# Patient Record
Sex: Female | Born: 1990 | Race: White | Hispanic: No | Marital: Married | Smoking: Never smoker
Health system: Southern US, Community
[De-identification: ages and names within clinical notes are randomized; demographics above are authoritative.]

## PROBLEM LIST (undated history)

## (undated) DIAGNOSIS — J302 Other seasonal allergic rhinitis: Secondary | ICD-10-CM

## (undated) DIAGNOSIS — Z87440 Personal history of urinary (tract) infections: Secondary | ICD-10-CM

## (undated) DIAGNOSIS — R51 Headache: Secondary | ICD-10-CM

## (undated) DIAGNOSIS — R011 Cardiac murmur, unspecified: Secondary | ICD-10-CM

## (undated) DIAGNOSIS — K219 Gastro-esophageal reflux disease without esophagitis: Secondary | ICD-10-CM

## (undated) DIAGNOSIS — F419 Anxiety disorder, unspecified: Secondary | ICD-10-CM

## (undated) DIAGNOSIS — R519 Headache, unspecified: Secondary | ICD-10-CM

---

## 2014-07-31 LAB — OB RESULTS CONSOLE HIV ANTIBODY (ROUTINE TESTING): HIV: NONREACTIVE

## 2014-07-31 LAB — OB RESULTS CONSOLE RUBELLA ANTIBODY, IGM: RUBELLA: IMMUNE

## 2014-07-31 LAB — OB RESULTS CONSOLE ABO/RH: RH TYPE: POSITIVE

## 2014-07-31 LAB — OB RESULTS CONSOLE GC/CHLAMYDIA
Chlamydia: NEGATIVE
Gonorrhea: NEGATIVE

## 2014-07-31 LAB — OB RESULTS CONSOLE RPR: RPR: NONREACTIVE

## 2014-07-31 LAB — OB RESULTS CONSOLE ANTIBODY SCREEN: Antibody Screen: NEGATIVE

## 2014-07-31 LAB — OB RESULTS CONSOLE HEPATITIS B SURFACE ANTIGEN: Hepatitis B Surface Ag: NEGATIVE

## 2014-10-30 ENCOUNTER — Inpatient Hospital Stay (HOSPITAL_COMMUNITY): Admission: AD | Admit: 2014-10-30 | Payer: Self-pay | Source: Ambulatory Visit | Admitting: Obstetrics and Gynecology

## 2014-12-23 LAB — OB RESULTS CONSOLE GBS: GBS: NEGATIVE

## 2015-03-25 ENCOUNTER — Encounter (HOSPITAL_COMMUNITY): Payer: Self-pay | Admitting: *Deleted

## 2015-03-25 ENCOUNTER — Inpatient Hospital Stay (HOSPITAL_COMMUNITY)
Admission: AD | Admit: 2015-03-25 | Discharge: 2015-03-28 | DRG: 765 | Disposition: A | Discharge status: Home / Self Care | Payer: 59 | Source: Ambulatory Visit | Attending: Obstetrics and Gynecology | Admitting: Obstetrics and Gynecology

## 2015-03-25 DIAGNOSIS — Z3403 Encounter for supervision of normal first pregnancy, third trimester: Secondary | ICD-10-CM | POA: Diagnosis present

## 2015-03-25 DIAGNOSIS — Z3A40 40 weeks gestation of pregnancy: Secondary | ICD-10-CM | POA: Diagnosis present

## 2015-03-25 DIAGNOSIS — O324XX0 Maternal care for high head at term, not applicable or unspecified: Secondary | ICD-10-CM | POA: Diagnosis present

## 2015-03-25 DIAGNOSIS — D649 Anemia, unspecified: Secondary | ICD-10-CM | POA: Diagnosis not present

## 2015-03-25 DIAGNOSIS — O339 Maternal care for disproportion, unspecified: Secondary | ICD-10-CM | POA: Diagnosis present

## 2015-03-25 DIAGNOSIS — O9962 Diseases of the digestive system complicating childbirth: Secondary | ICD-10-CM | POA: Diagnosis present

## 2015-03-25 DIAGNOSIS — O411230 Chorioamnionitis, third trimester, not applicable or unspecified: Secondary | ICD-10-CM | POA: Diagnosis present

## 2015-03-25 DIAGNOSIS — K219 Gastro-esophageal reflux disease without esophagitis: Secondary | ICD-10-CM | POA: Diagnosis present

## 2015-03-25 DIAGNOSIS — O9902 Anemia complicating childbirth: Secondary | ICD-10-CM | POA: Diagnosis not present

## 2015-03-25 DIAGNOSIS — Z98891 History of uterine scar from previous surgery: Secondary | ICD-10-CM

## 2015-03-25 HISTORY — DX: Personal history of urinary (tract) infections: Z87.440

## 2015-03-25 HISTORY — DX: Other seasonal allergic rhinitis: J30.2

## 2015-03-25 HISTORY — DX: Anxiety disorder, unspecified: F41.9

## 2015-03-25 HISTORY — DX: Headache, unspecified: R51.9

## 2015-03-25 HISTORY — DX: Cardiac murmur, unspecified: R01.1

## 2015-03-25 HISTORY — DX: Headache: R51

## 2015-03-25 LAB — TYPE AND SCREEN
ABO/RH(D): O POS
Antibody Screen: NEGATIVE

## 2015-03-25 LAB — CBC
HEMATOCRIT: 35.4 % — AB (ref 36.0–46.0)
Hemoglobin: 12.1 g/dL (ref 12.0–15.0)
MCH: 30.7 pg (ref 26.0–34.0)
MCHC: 34.2 g/dL (ref 30.0–36.0)
MCV: 89.8 fL (ref 78.0–100.0)
Platelets: 161 10*3/uL (ref 150–400)
RBC: 3.94 MIL/uL (ref 3.87–5.11)
RDW: 14.2 % (ref 11.5–15.5)
WBC: 14.5 10*3/uL — AB (ref 4.0–10.5)

## 2015-03-25 LAB — ABO/RH: ABO/RH(D): O POS

## 2015-03-25 MED ORDER — CITRIC ACID-SODIUM CITRATE 334-500 MG/5ML PO SOLN
30.0000 mL | ORAL | Status: DC | PRN
  Administered 2015-03-25 – 2015-03-26 (×2): 30 mL via ORAL
  Filled 2015-03-25 (×2): qty 15

## 2015-03-25 MED ORDER — LIDOCAINE HCL (PF) 1 % IJ SOLN
30.0000 mL | INTRAMUSCULAR | Status: DC | PRN
  Filled 2015-03-25: qty 30

## 2015-03-25 MED ORDER — OXYTOCIN 40 UNITS IN LACTATED RINGERS INFUSION - SIMPLE MED
1.0000 m[IU]/min | INTRAVENOUS | Status: DC
  Administered 2015-03-25: 1 m[IU]/min via INTRAVENOUS

## 2015-03-25 MED ORDER — OXYTOCIN 10 UNIT/ML IJ SOLN
INTRAMUSCULAR | Status: AC
  Filled 2015-03-25: qty 1

## 2015-03-25 MED ORDER — OXYCODONE-ACETAMINOPHEN 5-325 MG PO TABS: 1.0000 | ORAL_TABLET | ORAL | Status: DC | PRN

## 2015-03-25 MED ORDER — TERBUTALINE SULFATE 1 MG/ML IJ SOLN: 0.2500 mg | Freq: Once | INTRAMUSCULAR | Status: DC | PRN

## 2015-03-25 MED ORDER — FENTANYL 2.5 MCG/ML BUPIVACAINE 1/10 % EPIDURAL INFUSION (WH - ANES)
14.0000 mL/h | INTRAMUSCULAR | Status: DC | PRN
  Administered 2015-03-25 (×3): 14 mL/h via EPIDURAL
  Filled 2015-03-25 (×2): qty 125

## 2015-03-25 MED ORDER — OXYCODONE-ACETAMINOPHEN 5-325 MG PO TABS: 2.0000 | ORAL_TABLET | ORAL | Status: DC | PRN

## 2015-03-25 MED ORDER — ONDANSETRON HCL 4 MG/2ML IJ SOLN
4.0000 mg | Freq: Four times a day (QID) | INTRAMUSCULAR | Status: DC | PRN
  Administered 2015-03-26: 4 mg via INTRAVENOUS

## 2015-03-25 MED ORDER — EPHEDRINE 5 MG/ML INJ
10.0000 mg | INTRAVENOUS | Status: DC | PRN
  Filled 2015-03-25: qty 4

## 2015-03-25 MED ORDER — OXYTOCIN BOLUS FROM INFUSION: 500.0000 mL | INTRAVENOUS | Status: DC

## 2015-03-25 MED ORDER — PHENYLEPHRINE 40 MCG/ML (10ML) SYRINGE FOR IV PUSH (FOR BLOOD PRESSURE SUPPORT): 80.0000 ug | PREFILLED_SYRINGE | INTRAVENOUS | Status: DC | PRN

## 2015-03-25 MED ORDER — LACTATED RINGERS IV SOLN
500.0000 mL | INTRAVENOUS | Status: DC | PRN
  Administered 2015-03-26: 500 mL via INTRAVENOUS

## 2015-03-25 MED ORDER — ACETAMINOPHEN 325 MG PO TABS
650.0000 mg | ORAL_TABLET | ORAL | Status: DC | PRN
Start: 2015-03-25 — End: 2015-03-26
  Administered 2015-03-26: 650 mg via ORAL
  Filled 2015-03-25: qty 2

## 2015-03-25 MED ORDER — OXYTOCIN 40 UNITS IN LACTATED RINGERS INFUSION - SIMPLE MED
62.5000 mL/h | INTRAVENOUS | Status: DC
  Filled 2015-03-25: qty 1000

## 2015-03-25 MED ORDER — DIPHENHYDRAMINE HCL 50 MG/ML IJ SOLN: 12.5000 mg | INTRAMUSCULAR | Status: DC | PRN

## 2015-03-25 MED ORDER — LACTATED RINGERS IV SOLN
INTRAVENOUS | Status: DC
  Administered 2015-03-25 – 2015-03-26 (×4): via INTRAVENOUS

## 2015-03-25 MED ORDER — LIDOCAINE HCL (PF) 1 % IJ SOLN
INTRAMUSCULAR | Status: DC | PRN
  Administered 2015-03-25: 5 mL via EPIDURAL
  Administered 2015-03-25: 2 mL via EPIDURAL
  Administered 2015-03-25: 3 mL via EPIDURAL

## 2015-03-25 NOTE — Anesthesia Preprocedure Evaluation (Addendum)
Anesthesia Evaluation  Patient identified by MRN, date of birth, ID band Patient awake    Reviewed: Allergy & Precautions, NPO status , Patient's Chart, lab work & pertinent test results  Airway Mallampati: II  TM Distance: >3 FB Neck ROM: Full    Dental  (+) Teeth Intact, Dental Advisory Given   Pulmonary neg pulmonary ROS,  breath sounds clear to auscultation  Pulmonary exam normal       Cardiovascular Exercise Tolerance: Good negative cardio ROS Normal cardiovascular examRhythm:Regular Rate:Normal     Neuro/Psych negative neurological ROS     GI/Hepatic negative GI ROS, Neg liver ROS,   Endo/Other  negative endocrine ROS  Renal/GU negative Renal ROS     Musculoskeletal negative musculoskeletal ROS (+)   Abdominal   Peds  Hematology negative hematology ROS (+) anemia ,   Anesthesia Other Findings   Reproductive/Obstetrics (+) Pregnancy                            Anesthesia Physical Anesthesia Plan  ASA: II  Anesthesia Plan: Epidural   Post-op Pain Management:    Induction:   Airway Management Planned:   Additional Equipment:   Intra-op Plan:   Post-operative Plan:   Informed Consent: I have reviewed the patients History and Physical, chart, labs and discussed the procedure including the risks, benefits and alternatives for the proposed anesthesia with the patient or authorized representative who has indicated his/her understanding and acceptance.   Dental advisory given  Plan Discussed with: Anesthesiologist  Anesthesia Plan Comments: (Patient identified. Risks/Benefits/Options discussed with patient including but not limited to bleeding, infection, nerve damage, paralysis, failed block, incomplete pain control, headache, blood pressure changes, nausea, vomiting, reactions to medication both or allergic, itching and postpartum back pain. Confirmed with bedside nurse the  patient's most recent platelet count. Confirmed with patient that they are not currently taking any anticoagulation, have any bleeding history or any family history of bleeding disorders. Patient expressed understanding and wished to proceed. All questions were answered. )        Anesthesia Quick Evaluation

## 2015-03-25 NOTE — Progress Notes (Addendum)
  Subjective: Received report and assumed care of [place], 24 yo G1P0 @ 40.1 wks admitted this morning for regular ctxs/active labor. Reports ongoing pressure in butt. Doula and family at bedside.  Objective: BP 116/63 mmHg  Pulse 87  Temp(Src) 99.8 F (37.7 C) (Axillary)  Resp 20  Ht 5' 6.5" (1.689 m)  Wt 84.823 kg (187 lb)  BMI 29.73 kg/m2  SpO2 100%     Today's Vitals   03/25/15 1931 03/25/15 2001 03/25/15 2041 03/25/15 2101  BP: 114/73 89/63 116/78 116/63  Pulse: 98 131 86 87  Temp:   99.8 F (37.7 C)   TempSrc:   Axillary   Resp: Height:      Weight:      SpO2:      PainSc:       FHT: BL 148 w/ moderate variability, +accels, earlys and variables to 120 bpm x 30 secs w/ good recovery UC:   irregular, every 3-6 minutes, coupling noted.  SVE: C/C/0 @ 20:01 PM Head well applied, narrow outlet IUPC placed w/ ease at 20:01 PM     Assessment:  24 yo G1P0 @ 40.1 wks 2nd stage labor Cat 2 FHRT GBS neg Inadequate MVUs at first glance   Plan: Trial push attempted w/ minimal progress, therefore passive descent x 1 hr w/ position changes. Discussed benefit of augmenting w/ Pitocin to aid with further descent, and to increase the intensity of her ctxs. Refuses at this time -- wants to labor down/reposition first. If no progress after 1 hr, she is open to revisiting topic.   Continue other intrauterine resuscitative measures prn. Expect progress and SVD. Dr. Normand Sloop updated.   Sherre Scarlet CNM 03/25/2015, 9:10 PM

## 2015-03-25 NOTE — Progress Notes (Signed)
Addendum Pt has strong urge to push VE swollen AL, pt encouraged not to push at this time.  Pt placed on her left side with a peanut ball FHT 148, no decels noted

## 2015-03-25 NOTE — Anesthesia Procedure Notes (Signed)
Epidural Patient location during procedure: OB  Staffing Anesthesiologist: Lenisha Lacap EDWARD Performed by: anesthesiologist   Preanesthetic Checklist Completed: patient identified, pre-op evaluation, timeout performed, IV checked, risks and benefits discussed and monitors and equipment checked  Epidural Patient position: sitting Prep: DuraPrep Patient monitoring: blood pressure and continuous pulse ox Approach: midline Location: L3-L4 Injection technique: LOR air  Needle:  Needle type: Tuohy  Needle gauge: 17 G Needle length: 9 cm Needle insertion depth: 5 cm Catheter size: 19 Gauge Catheter at skin depth: 10 cm Test dose: negative and Other (1% Lidocaine)  Additional Notes Patient identified.  Risk benefits discussed including failed block, incomplete pain control, headache, nerve damage, paralysis, blood pressure changes, nausea, vomiting, reactions to medication both toxic or allergic, and postpartum back pain.  Patient expressed understanding and wished to proceed.  All questions were answered.  Sterile technique used throughout procedure and epidural site dressed with sterile barrier dressing. No paresthesia or other complications noted. The patient did not experience any signs of intravascular injection such as tinnitus or metallic taste in mouth nor signs of intrathecal spread such as rapid motor block. Please see nursing notes for vital signs. Reason for block:procedure for pain   

## 2015-03-25 NOTE — Progress Notes (Signed)
Labor Progress  Subjective: Pt c/o severe back labor.  She managing with, movement, position changes, family support and a doula.  She at times gets overwhelm but is able to manage to get thru it.  Objective: BP 129/69 mmHg  Pulse 100  Temp(Src) 98 F (36.7 C) (Oral)  Resp 20     FHT: 150, strong, noted short variable  CTX:  regular, every 3-4 minutes Uterus gravid, soft non tender SVE:  Dilation: 8 Effacement (%): 80 Station: 0 Exam by:: Inocencia Murtaugh cnm Cervix is starting to swell anteriorly  Assessment:  IUP at 40.1 weeks FHT  Membranes:  SROM at 1239 light mec Labor progress: adquate labor GBS: negative Nurse administered sterile water injections  Plan: Continue labor plan Intermittent monitoring Rest/Ambulate Frequent position changes to facilitate fetal rotation and descent. Will reassess with cervical exam at 1500 or earlier if necessary      Josiah Wojtaszek, CNM, MSN 03/25/2015. 1:49 PM

## 2015-03-25 NOTE — Progress Notes (Signed)
S: Urge more intense, desires to formally begin pushing.  O:  Today's Vitals   03/25/15 2001 03/25/15 2041 03/25/15 2101 03/25/15 2131  BP: 89/63 116/78 116/63 128/76  Pulse: 131 86 87 94  Temp:  99.8 F (37.7 C)    TempSrc:  Axillary    Resp: Height:      Weight:      SpO2:      PainSc:       FHR: BL 145 w/ moderate variability, +accels, +variables Ctxs: q 1-4 min, MVUs 150-180 Cvx: C/C/0  A: 24 yo G1P0 @ 40.1 wks 2nd stage labor MVUs approaching adequacy  P: Commence pushing. Anticipate SVD. Consult prn.  Sherre Scarlet, CNM 03/25/15, 9:46 PM

## 2015-03-25 NOTE — MAU Note (Signed)
Contractions started around 0415, have been every 2-3, became more intense. No bleeding or leaking.

## 2015-03-25 NOTE — Progress Notes (Signed)
Addendum Pt is feeling much better with the epidural.  She's able to feel ctx as pressure but it's bearable.  Having a clear diet tray  VE, AL still there just not as prominent and slightly reduce able  FHR  145, moderate variability, + accel, occasional early decel, ctx q 3-39minutes

## 2015-03-25 NOTE — Progress Notes (Addendum)
Consents to Pitocin augmentation after R/B reviewed. Will begin w/ 1x1 initially. Reassuring FHRT.   Sherre Scarlet, CNM 03/25/15, 10:14 PM

## 2015-03-25 NOTE — H&P (Signed)
Veronica Bass is a 24 y.o. female, G1 P0 at 40.1 weeks presented to MAU with ctx q 3-4, denies vb or lof w/+FM.  Pt desires and water birth and has a tub and a doula.  Pt states she did not take a waterbirth class and was never told she needed totake a class.  Veronica Bass notes does not note any mention of a water birth.  Pt informed and accepts the hospital protocol.  There are no active problems to display for this patient.   Pregnancy Course: Patient entered care at 8.1 weeks.   EDC of 03/24/15 was established by Veronica Bass.   Veronica Bass evaluations:   8.1 weeks - Dating: FHR 148, anterverted uterus  19.0 weeks - Anatomy:   vertex. anterior placenta. no previa. placenta edge to cx 7.1 cm. placental CI seen. normal fluid. AP pocket 4.3 cm. Female. Cx closed. adnexa - remarkable   Significant prenatal events:   none   Last evaluation:   40.0 weeks   VE:0/0/-2 on 03/20/15  Reason for admission:  labor  Pt States:   Contractions Frequency: 3-4         Contraction severity: strong         Fetal activity: +FM  OB History    Gravida Para Term Preterm AB TAB SAB Ectopic Multiple Living   1              No past medical history on file. No past surgical history on file. Family History: family history is not on file. Social History:  has no tobacco, alcohol, and drug history on file.   Prenatal Transfer Tool  Maternal Diabetes: No Genetic Screening: Normal Maternal Ultrasounds/Referrals: Normal Fetal Ultrasounds or other Referrals:  None Maternal Substance Abuse:  No Significant Maternal Medications:  None Significant Maternal Lab Results: None   ROS:  See HPI above, all other systems are negative  No Known Allergies  Dilation: 5.5 Effacement (%): 80 Station: 0 Exam by:: Veronica Bass Blood pressure 113/69, pulse 84, temperature 98 F (36.7 C), temperature source Oral, resp. rate 20.  Maternal Exam:  Uterine Assessment: Contraction frequency is rare.  Abdomen: Gravid, non tender. Fundal height is  aga.  Normal external genitalia, vulva, cervix, uterus and adnexa.  No lesions noted on exam.  Pelvis adequate for delivery.  Fetal presentation: Vertex by VE  Fetal Exam:  Monitor Surveillance : Continuous Monitoring -  Mode: Ultrasound.  NICHD: Category 1 CTXs: Q 3-28minutes EFW   7 lbs  Physical Exam: Nursing note and vitals reviewed General: alert and cooperative She appears well nourished Psychiatric: Normal mood and affect. Her behavior is normal Head: Normocephalic Eyes: Pupils are equal, round, and reactive to light Neck: Normal range of motion Cardiovascular: RRR without murmur  Respiratory: CTAB. Effort normal  Abd: soft, non-tender, +BS, no rebound, no guarding  Genitourinary: Vagina normal  Neurological: A&Ox3 Skin: Warm and dry  Musculoskeletal: Normal range of motion  Homan's sign negative bilaterally No evidence of DVTs.  Edema: Minimal bilaterally non-pitting edema DTR: 2+ Clonus: None   Prenatal labs: ABO, Rh:  O positive Antibody:  negative Rubella:   immune RPR:   NR HBsAg:    negative HIV:   NR GBS:  negative Sickle cell/Hgb electrophoresis:  WNL Pap:   GC:   negative Chlamydia: negative Genetic screenings:  negative Glucola:  negative   Assessment:  IUP at 40.1 weeks NICHD: Category 1 Membranes: BBW GBS negative  Plan:  Admit to L&D for expectant management of labor. Possible  augmentation options reviewed including foley bulb, AROM and/or pitocin.  IV pain medication per orders PRN Epidural per patient request Foley cath after patient is comfortable with epidural Anticipate SVD  Labor mgmt as ordered Desires water birth  But never took the class Okay to ambulate around unit with wireless monitors  Okay to get up and shower without monitoring   May auscultate FHR intermittently,  if expectant management     q 30 min in active labor - x 5 minutes     q 15 min in transition - before during and after a ctx     q 5 min with  pushing - before during and after a ctx.     May ambulate without monitoring.     If no active labor, may do NST q 2 hours.   Attending MD available at all times.     Veronica Bass, CNM, MSN 03/25/2015, 8:58 AM

## 2015-03-25 NOTE — Progress Notes (Signed)
Labor Progress  Subjective: Pt is exhausted.  She is involuntary pushing with each ctx.  We reviewed pushing with a AL will cause swelling.  We discussed pain management options and pt elected for an epidural  Objective: BP 130/70 mmHg  Pulse 97  Temp(Src) 98.1 F (36.7 C) (Oral)  Resp 20  SpO2 100%     FHT:156, no decel prior during or after a ctx CTX:  regular, every 3-4 minutes Uterus gravid, soft non tender SVE:  Dilation: Lip/rim (swelling) Effacement (%): 80 Station: 0 Exam by:: Delaney Schnick cnm   Assessment:  IUP at 40.1 weeks NICHD: Category 1 Membranes:  SROM x 4hrs, no s/s of infection Labor progress: Inadquate labor GBS: negative   Plan: Continue labor plan Epidural per pt request Continuous monitoring Rest Frequent position changes to facilitate fetal rotation and descent. Will reassess with cervical exam at 1800 or earlier if necessary       Bennett Ram, CNM, MSN 03/25/2015. 4:34 PM

## 2015-03-26 ENCOUNTER — Encounter (HOSPITAL_COMMUNITY)
Admission: AD | Disposition: A | Discharge status: Home / Self Care | Payer: Self-pay | Source: Ambulatory Visit | Attending: Obstetrics and Gynecology

## 2015-03-26 ENCOUNTER — Inpatient Hospital Stay (HOSPITAL_COMMUNITY): Payer: 59 | Admitting: Anesthesiology

## 2015-03-26 ENCOUNTER — Encounter (HOSPITAL_COMMUNITY): Payer: Self-pay | Admitting: *Deleted

## 2015-03-26 DIAGNOSIS — Z98891 History of uterine scar from previous surgery: Secondary | ICD-10-CM

## 2015-03-26 LAB — RPR: RPR Ser Ql: NONREACTIVE

## 2015-03-26 LAB — CBC
HEMATOCRIT: 26.6 % — AB (ref 36.0–46.0)
HEMOGLOBIN: 9.1 g/dL — AB (ref 12.0–15.0)
MCH: 31 pg (ref 26.0–34.0)
MCHC: 34.2 g/dL (ref 30.0–36.0)
MCV: 90.5 fL (ref 78.0–100.0)
Platelets: 149 10*3/uL — ABNORMAL LOW (ref 150–400)
RBC: 2.94 MIL/uL — ABNORMAL LOW (ref 3.87–5.11)
RDW: 14.5 % (ref 11.5–15.5)
WBC: 15.9 10*3/uL — ABNORMAL HIGH (ref 4.0–10.5)

## 2015-03-26 SURGERY — Surgical Case
Anesthesia: Epidural

## 2015-03-26 MED ORDER — SODIUM CHLORIDE 0.9 % IV SOLN
3.0000 g | Freq: Four times a day (QID) | INTRAVENOUS | Status: DC
  Filled 2015-03-26 (×3): qty 3

## 2015-03-26 MED ORDER — SODIUM BICARBONATE 8.4 % IV SOLN
INTRAVENOUS | Status: DC | PRN
  Administered 2015-03-26 (×3): 5 mL via EPIDURAL

## 2015-03-26 MED ORDER — OXYCODONE-ACETAMINOPHEN 5-325 MG PO TABS
1.0000 | ORAL_TABLET | ORAL | Status: DC | PRN
  Administered 2015-03-27 – 2015-03-28 (×5): 1 via ORAL
  Filled 2015-03-26 (×6): qty 1

## 2015-03-26 MED ORDER — NALOXONE HCL 1 MG/ML IJ SOLN
1.0000 ug/kg/h | INTRAVENOUS | Status: DC | PRN
  Filled 2015-03-26: qty 2

## 2015-03-26 MED ORDER — MENTHOL 3 MG MT LOZG: 1.0000 | LOZENGE | OROMUCOSAL | Status: DC | PRN

## 2015-03-26 MED ORDER — NALBUPHINE HCL 10 MG/ML IJ SOLN: 5.0000 mg | INTRAMUSCULAR | Status: DC | PRN

## 2015-03-26 MED ORDER — KETOROLAC TROMETHAMINE 30 MG/ML IJ SOLN
INTRAMUSCULAR | Status: AC
  Filled 2015-03-26: qty 1

## 2015-03-26 MED ORDER — DIPHENHYDRAMINE HCL 50 MG/ML IJ SOLN: 12.5000 mg | INTRAMUSCULAR | Status: DC | PRN

## 2015-03-26 MED ORDER — NALOXONE HCL 0.4 MG/ML IJ SOLN: 0.4000 mg | INTRAMUSCULAR | Status: DC | PRN

## 2015-03-26 MED ORDER — SIMETHICONE 80 MG PO CHEW: 80.0000 mg | CHEWABLE_TABLET | ORAL | Status: DC | PRN

## 2015-03-26 MED ORDER — NALBUPHINE HCL 10 MG/ML IJ SOLN: 5.0000 mg | Freq: Once | INTRAMUSCULAR | Status: DC | PRN

## 2015-03-26 MED ORDER — ACETAMINOPHEN 500 MG PO TABS
1000.0000 mg | ORAL_TABLET | Freq: Four times a day (QID) | ORAL | Status: AC
  Administered 2015-03-26 – 2015-03-27 (×2): 1000 mg via ORAL
  Filled 2015-03-26 (×2): qty 2

## 2015-03-26 MED ORDER — MORPHINE SULFATE (PF) 0.5 MG/ML IJ SOLN
INTRAMUSCULAR | Status: DC | PRN
  Administered 2015-03-26: 3000 ug via INTRAVENOUS
  Administered 2015-03-26: 2000 ug via INTRAVENOUS

## 2015-03-26 MED ORDER — MEPERIDINE HCL 25 MG/ML IJ SOLN: 6.2500 mg | INTRAMUSCULAR | Status: DC | PRN

## 2015-03-26 MED ORDER — CHLOROPROCAINE HCL 1 % IJ SOLN
INTRAMUSCULAR | Status: DC | PRN
  Administered 2015-03-26: 20 mL

## 2015-03-26 MED ORDER — DIPHENHYDRAMINE HCL 25 MG PO CAPS: 25.0000 mg | ORAL_CAPSULE | Freq: Four times a day (QID) | ORAL | Status: DC | PRN

## 2015-03-26 MED ORDER — FENTANYL CITRATE (PF) 100 MCG/2ML IJ SOLN: 25.0000 ug | INTRAMUSCULAR | Status: DC | PRN

## 2015-03-26 MED ORDER — BISACODYL 10 MG RE SUPP: 10.0000 mg | Freq: Every day | RECTAL | Status: DC | PRN

## 2015-03-26 MED ORDER — SIMETHICONE 80 MG PO CHEW
80.0000 mg | CHEWABLE_TABLET | ORAL | Status: DC
  Administered 2015-03-27 (×2): 80 mg via ORAL
  Filled 2015-03-26 (×2): qty 1

## 2015-03-26 MED ORDER — KETOROLAC TROMETHAMINE 30 MG/ML IJ SOLN
30.0000 mg | Freq: Four times a day (QID) | INTRAMUSCULAR | Status: DC | PRN
  Administered 2015-03-26: 30 mg via INTRAMUSCULAR

## 2015-03-26 MED ORDER — PHENYLEPHRINE HCL 10 MG/ML IJ SOLN
INTRAMUSCULAR | Status: DC | PRN
  Administered 2015-03-26: 30 ug via INTRAVENOUS
  Administered 2015-03-26: 60 ug via INTRAVENOUS
  Administered 2015-03-26: 40 ug via INTRAVENOUS

## 2015-03-26 MED ORDER — LACTATED RINGERS IV SOLN
INTRAVENOUS | Status: DC
Start: 2015-03-26 — End: 2015-03-28
  Administered 2015-03-26: 06:00:00 via INTRAVENOUS

## 2015-03-26 MED ORDER — ZOLPIDEM TARTRATE 5 MG PO TABS: 5.0000 mg | ORAL_TABLET | Freq: Every evening | ORAL | Status: DC | PRN

## 2015-03-26 MED ORDER — OXYTOCIN 40 UNITS IN LACTATED RINGERS INFUSION - SIMPLE MED: 62.5000 mL/h | INTRAVENOUS | Status: AC

## 2015-03-26 MED ORDER — ACETAMINOPHEN 325 MG PO TABS: 650.0000 mg | ORAL_TABLET | ORAL | Status: DC | PRN

## 2015-03-26 MED ORDER — SIMETHICONE 80 MG PO CHEW
80.0000 mg | CHEWABLE_TABLET | Freq: Three times a day (TID) | ORAL | Status: DC
  Administered 2015-03-26 – 2015-03-28 (×5): 80 mg via ORAL
  Filled 2015-03-26 (×6): qty 1

## 2015-03-26 MED ORDER — SENNOSIDES-DOCUSATE SODIUM 8.6-50 MG PO TABS
2.0000 | ORAL_TABLET | ORAL | Status: DC
  Administered 2015-03-27 (×2): 2 via ORAL
  Filled 2015-03-26 (×2): qty 2

## 2015-03-26 MED ORDER — PROMETHAZINE HCL 25 MG/ML IJ SOLN: 6.2500 mg | INTRAMUSCULAR | Status: DC | PRN

## 2015-03-26 MED ORDER — WITCH HAZEL-GLYCERIN EX PADS: 1.0000 "application " | MEDICATED_PAD | CUTANEOUS | Status: DC | PRN

## 2015-03-26 MED ORDER — OXYCODONE-ACETAMINOPHEN 5-325 MG PO TABS: 2.0000 | ORAL_TABLET | ORAL | Status: DC | PRN

## 2015-03-26 MED ORDER — HYDROMORPHONE HCL 1 MG/ML IJ SOLN
INTRAMUSCULAR | Status: DC | PRN
  Administered 2015-03-26: 1 mg via INTRAVENOUS

## 2015-03-26 MED ORDER — LANOLIN HYDROUS EX OINT: 1.0000 "application " | TOPICAL_OINTMENT | CUTANEOUS | Status: DC | PRN

## 2015-03-26 MED ORDER — FLEET ENEMA 7-19 GM/118ML RE ENEM: 1.0000 | ENEMA | Freq: Every day | RECTAL | Status: DC | PRN

## 2015-03-26 MED ORDER — FENTANYL CITRATE (PF) 100 MCG/2ML IJ SOLN
INTRAMUSCULAR | Status: DC | PRN
  Administered 2015-03-26: 100 ug via INTRAVENOUS

## 2015-03-26 MED ORDER — MEASLES, MUMPS & RUBELLA VAC ~~LOC~~ INJ: 0.5000 mL | INJECTION | Freq: Once | SUBCUTANEOUS | Status: DC

## 2015-03-26 MED ORDER — ONDANSETRON HCL 4 MG/2ML IJ SOLN: 4.0000 mg | Freq: Three times a day (TID) | INTRAMUSCULAR | Status: DC | PRN

## 2015-03-26 MED ORDER — DIPHENHYDRAMINE HCL 25 MG PO CAPS: 25.0000 mg | ORAL_CAPSULE | ORAL | Status: DC | PRN

## 2015-03-26 MED ORDER — TETANUS-DIPHTH-ACELL PERTUSSIS 5-2.5-18.5 LF-MCG/0.5 IM SUSP: 0.5000 mL | Freq: Once | INTRAMUSCULAR | Status: DC

## 2015-03-26 MED ORDER — OXYTOCIN 10 UNIT/ML IJ SOLN
40.0000 [IU] | INTRAVENOUS | Status: DC | PRN
  Administered 2015-03-26: 40 [IU] via INTRAVENOUS

## 2015-03-26 MED ORDER — KETOROLAC TROMETHAMINE 30 MG/ML IJ SOLN
30.0000 mg | Freq: Four times a day (QID) | INTRAMUSCULAR | Status: DC | PRN
Start: 2015-03-26 — End: 2015-03-26

## 2015-03-26 MED ORDER — SCOPOLAMINE 1 MG/3DAYS TD PT72
1.0000 | MEDICATED_PATCH | Freq: Once | TRANSDERMAL | Status: DC
  Filled 2015-03-26: qty 1

## 2015-03-26 MED ORDER — FERROUS SULFATE 325 (65 FE) MG PO TABS
325.0000 mg | ORAL_TABLET | Freq: Two times a day (BID) | ORAL | Status: DC
  Administered 2015-03-26 – 2015-03-28 (×4): 325 mg via ORAL
  Filled 2015-03-26 (×4): qty 1

## 2015-03-26 MED ORDER — SODIUM CHLORIDE 0.9 % IV SOLN
3.0000 g | Freq: Four times a day (QID) | INTRAVENOUS | Status: AC
  Administered 2015-03-26 – 2015-03-27 (×4): 3 g via INTRAVENOUS
  Filled 2015-03-26 (×4): qty 3

## 2015-03-26 MED ORDER — LACTATED RINGERS IV BOLUS (SEPSIS): 500.0000 mL | Freq: Once | INTRAVENOUS | Status: DC

## 2015-03-26 MED ORDER — DIBUCAINE 1 % RE OINT: 1.0000 "application " | TOPICAL_OINTMENT | RECTAL | Status: DC | PRN

## 2015-03-26 MED ORDER — SODIUM CHLORIDE 0.9 % IJ SOLN: 3.0000 mL | INTRAMUSCULAR | Status: DC | PRN

## 2015-03-26 MED ORDER — PRENATAL MULTIVITAMIN CH
1.0000 | ORAL_TABLET | Freq: Every day | ORAL | Status: DC
  Administered 2015-03-27 – 2015-03-28 (×2): 1 via ORAL
  Filled 2015-03-26 (×2): qty 1

## 2015-03-26 MED ORDER — IBUPROFEN 600 MG PO TABS
600.0000 mg | ORAL_TABLET | Freq: Four times a day (QID) | ORAL | Status: DC
  Administered 2015-03-26 – 2015-03-28 (×7): 600 mg via ORAL
  Filled 2015-03-26 (×8): qty 1

## 2015-03-26 SURGICAL SUPPLY — 39 items
BENZOIN TINCTURE PRP APPL 2/3 (GAUZE/BANDAGES/DRESSINGS) ×3 IMPLANT
CLAMP CORD UMBIL (MISCELLANEOUS) IMPLANT
CLOSURE WOUND 1/2 X4 (GAUZE/BANDAGES/DRESSINGS) ×1
CLOTH BEACON ORANGE TIMEOUT ST (SAFETY) ×3 IMPLANT
CONTAINER PREFILL 10% NBF 15ML (MISCELLANEOUS) IMPLANT
DRAIN JACKSON PRT FLT 10 (DRAIN) IMPLANT
DRAPE SHEET LG 3/4 BI-LAMINATE (DRAPES) IMPLANT
DRSG OPSITE POSTOP 4X10 (GAUZE/BANDAGES/DRESSINGS) ×3 IMPLANT
DURAPREP 26ML APPLICATOR (WOUND CARE) ×3 IMPLANT
ELECT REM PT RETURN 9FT ADLT (ELECTROSURGICAL) ×3
ELECTRODE REM PT RTRN 9FT ADLT (ELECTROSURGICAL) ×1 IMPLANT
EVACUATOR SILICONE 100CC (DRAIN) IMPLANT
EXTRACTOR VACUUM M CUP 4 TUBE (SUCTIONS) IMPLANT
EXTRACTOR VACUUM M CUP 4' TUBE (SUCTIONS)
GLOVE BIO SURGEON STRL SZ 6.5 (GLOVE) ×2 IMPLANT
GLOVE BIO SURGEONS STRL SZ 6.5 (GLOVE) ×1
GLOVE BIOGEL PI IND STRL 7.0 (GLOVE) ×1 IMPLANT
GLOVE BIOGEL PI INDICATOR 7.0 (GLOVE) ×2
GOWN STRL REUS W/TWL LRG LVL3 (GOWN DISPOSABLE) ×6 IMPLANT
HEMOSTAT SURGICEL 2X14 (HEMOSTASIS) ×3 IMPLANT
KIT ABG SYR 3ML LUER SLIP (SYRINGE) IMPLANT
NEEDLE HYPO 25X5/8 SAFETYGLIDE (NEEDLE) IMPLANT
NS IRRIG 1000ML POUR BTL (IV SOLUTION) ×3 IMPLANT
PACK C SECTION WH (CUSTOM PROCEDURE TRAY) ×3 IMPLANT
PAD OB MATERNITY 4.3X12.25 (PERSONAL CARE ITEMS) ×3 IMPLANT
RETRACTOR WND ALEXIS 25 LRG (MISCELLANEOUS) ×1 IMPLANT
RTRCTR C-SECT PINK 25CM LRG (MISCELLANEOUS) IMPLANT
RTRCTR WOUND ALEXIS 25CM LRG (MISCELLANEOUS) ×3
STRIP CLOSURE SKIN 1/2X4 (GAUZE/BANDAGES/DRESSINGS) ×2 IMPLANT
SUT CHROMIC 0 CT 1 (SUTURE) ×3 IMPLANT
SUT MNCRL AB 3-0 PS2 27 (SUTURE) ×3 IMPLANT
SUT PLAIN 2 0 (SUTURE) ×4
SUT PLAIN 2 0 XLH (SUTURE) ×3 IMPLANT
SUT PLAIN ABS 2-0 CT1 27XMFL (SUTURE) ×2 IMPLANT
SUT SILK 2 0 SH (SUTURE) IMPLANT
SUT VIC AB 0 CTX 36 (SUTURE) ×8
SUT VIC AB 0 CTX36XBRD ANBCTRL (SUTURE) ×4 IMPLANT
TOWEL OR 17X24 6PK STRL BLUE (TOWEL DISPOSABLE) ×3 IMPLANT
TRAY FOLEY CATH SILVER 14FR (SET/KITS/TRAYS/PACK) ×3 IMPLANT

## 2015-03-26 NOTE — Addendum Note (Signed)
Addendum  created 03/26/15 1526 by Shanon Payor, CRNA   Modules edited: Notes Section   Notes Section:  File: [id number]

## 2015-03-26 NOTE — Transfer of Care (Signed)
Immediate Anesthesia Transfer of Care Note  Patient: Veronica Bass  Procedure(s) Performed: Procedure(s): CESAREAN SECTION (N/A)  Patient Location: PACU  Anesthesia Type:Epidural  Level of Consciousness: awake, alert  and oriented  Airway & Oxygen Therapy: Patient Spontanous Breathing  Post-op Assessment: Report given to RN and Post -op Vital signs reviewed and stable  Post vital signs: Reviewed and stable  Last Vitals:  Filed Vitals:   03/26/15 0136  BP: 132/73  Pulse: 127  Temp:   Resp: 20    Complications: No apparent anesthesia complications

## 2015-03-26 NOTE — Progress Notes (Signed)
S: In to assess pushing efforts. Began pushing at 21:37 PM after "laboring down" for a little over an hr, however admits to not laboring down at all due to overwhelming urge to push.  Became complete at 20:01 PM. Pt grunting and straining w/ each ctx resulting in loss of voice. Given Oracit 30 ml po at 22:22 PM for acid reflux. Support personnel at bedside.   O:  Today's Vitals   03/25/15 2201 03/25/15 2250 03/25/15 2301 03/25/15 2332  BP: 126/75  138/51 107/62  Pulse: 82  140 100  Temp:  98.7 F (37.1 C)    TempSrc:  Oral    Resp: Height:      Weight:      SpO2:      PainSc:       Gen: Obvious fatigue, rasp voice FHR: BL 170-180 w/ moderate variability, occ accels, intermittent lates and variables despite position change and instituting other intrauterine resuscitative measures Ctxs: q 2-5 min - MVUs 125-175 Approaching 12 hrs s/p SROM, now w/ elevated axillary temp of 100.5 Pitocin at 3 mU/min  A: 2nd stage labor w/o descent despite passive descent and 2 1/2 hrs of coached pushing -- molding noted at +1 station, vtx at 0. Maternal exhaustion. Fetal tachycardia. Elevated maternal temp (axillary). Suspected chorio. Suspected CPD.  P: Tylenol 2 tabs po now 500 ml LR bolus Unasyn 3 gm IV D/C Pitocin Given option to 1) Continue pushing or 2) proceed w/ c-section due to failure to descend. The R/B/A of c-section including but not limited to bleeding, infection and injury were discussed with the patient. She verbalized understanding; consent signed and witnessed. Informed of non-candidacy of VAVD due to EFW and suspected chorio. Consulted Dr. Normand Sloop re: pt's desire to proceed w/ primary c-section.  Sherre Scarlet, CNM 03/26/2015, 12:03 AM

## 2015-03-26 NOTE — Anesthesia Postprocedure Evaluation (Signed)
  Anesthesia Post-op Note  Patient: Veronica Bass  Procedure(s) Performed: Procedure(s): CESAREAN SECTION (N/A)  Patient Location: Mother/Baby  Anesthesia Type:Epidural  Level of Consciousness: awake, alert  and oriented  Airway and Oxygen Therapy: Patient Spontanous Breathing  Post-op Pain: none  Post-op Assessment: Post-op Vital signs reviewed, Patient's Cardiovascular Status Stable, Respiratory Function Stable, Patent Airway, Adequate PO intake, No headache, No backache and Patient able to bend at knees LLE Motor Response: Purposeful movement LLE Sensation: Numbness RLE Motor Response: Purposeful movement RLE Sensation: Numbness L Sensory Level: T12-Inguinal (groin) region R Sensory Level: T12-Inguinal (groin) region  Post-op Vital Signs: Reviewed and stable  Last Vitals:  Filed Vitals:   03/26/15 1300  BP: 106/49  Pulse: 83  Temp: 37 C  Resp: 18    Complications: No apparent anesthesia complications

## 2015-03-26 NOTE — Anesthesia Postprocedure Evaluation (Signed)
  Anesthesia Post-op Note  Patient: Veronica Bass  Procedure(s) Performed: Procedure(s): CESAREAN SECTION (N/A)  Patient Location: PACU  Anesthesia Type:Epidural  Level of Consciousness: awake  Airway and Oxygen Therapy: Patient Spontanous Breathing  Post-op Pain: none  Post-op Assessment: Post-op Vital signs reviewed, Patient's Cardiovascular Status Stable, Respiratory Function Stable, Patent Airway and No signs of Nausea or vomiting              Post-op Vital Signs: Reviewed and stable  Last Vitals:  Filed Vitals:   03/26/15 0325  BP: 115/71  Pulse: 88  Temp: 37.1 C  Resp: 28    Complications: No apparent anesthesia complications

## 2015-03-26 NOTE — Progress Notes (Signed)
Subjective: Postpartum Day 0: Cesarean Delivery due to failure to decend Patient up ad lib, reports no syncope or dizziness. Feeding:  breast Contraceptive plan:  unsure  Objective: Vital signs in last 24 hours: Temp:  [98.1 F (36.7 C)-100.5 F (38.1 C)] 98.5 F (36.9 C) (08/10 0800) Pulse Rate:  [77-140] 91 (08/10 0657) Resp:  [18-28] 18 (08/10 0800) BP: (89-143)/(44-116) 98/44 mmHg (08/10 0800) SpO2:  [83 %-100 %] 95 % (08/10 0800) Weight:  [187 lb (84.823 kg)] 187 lb (84.823 kg) (08/09 1818)  Physical Exam:  General: alert and cooperative Lochia: appropriate Uterine Fundus: firm Abdomen:  + bowel sounds, non distended Incision: no significant drainage  pressure dressing CDI DVT Evaluation: No evidence of DVT seen on physical exam. Homan's sign: Negative   Recent Labs  03/25/15 0920 03/26/15 0649  HGB 12.1 9.1*  HCT 35.4* 26.6*  WBC 14.5* 15.9*   Orthostatics stable.  Assessment: Status post Cesarean section day 0. Doing well postoperatively.  pressure dressing in place, no significant drainage Anemia - hemodynamicly stable.      Plan: Continue current care. Breastfeeding and Lactation consult Dr. Estanislado Pandy updated on patient status Unasyn   Lua Feng, CNM, MSN 03/26/2015. 9:37 AM

## 2015-03-26 NOTE — Lactation Note (Signed)
This note was copied from the chart of Girl [place]. Lactation Consultation Note  Patient Name: Girl Lillyann Ahart Today's Date: 03/26/2015 Reason for consult: Initial assessment (mom encouraged to call for feeding assessment - latch check )  Baby is 13 hours old and has been to the breast x 3 for 5 - 10 -30 mins feedings . Per mom recently attempted ,  Baby sleepy. No latch score as of yet . HNV , 1 stool.  Mom presently holding baby , expecting grand parents any moment to visit. LC recommended to call on nurses light when baby is showing  Feeding cues for feeding assessment - latch check.  Mother informed of post-discharge support and given phone number to the lactation department, including services for phone call assistance;  out-patient appointments; and breastfeeding support group. List of other breastfeeding resources in the community given in the handout.  Encouraged mother to call for problems or concerns related to breastfeeding.   Maternal Data Does the patient have breastfeeding experience prior to this delivery?: No  Feeding Feeding Type: Breast Fed  LATCH Score/Interventions                      Lactation Tools Discussed/Used     Consult Status Consult Status: Follow-up Date: 03/26/15 Follow-up type: In-patient    Kathrin Greathouse 03/26/2015, 3:27 PM

## 2015-03-26 NOTE — Addendum Note (Signed)
Addendum  created 03/26/15 1338 by Yolonda Kida, CRNA   Modules edited: Anesthesia LDA, Lines/Drains/Airways Properties Editor   Lines/Drains/Airways Properties Editor:  Properties of line/drain/airway/wound [REMOVED] Epidural Catheter 03/25/15 have been modified.; Line/drain/airway/wound Epidural Catheter 03/25/15 has been removed from the patient.

## 2015-03-26 NOTE — Op Note (Signed)
Cesarean Section Procedure Note   Veronica Bass  03/25/2015 - 03/26/2015  Indications: Dystocia   Pre-operative Diagnosis: Failure to Descend .   Post-operative Diagnosis: Same   Surgeon: Surgeon(s) and Role:    * Jaymes Graff, MD - Primary    * Mitchel Honour, DO - Assisting  Procedure LTCS with T extension on the uterus  Assistants: Dr Langston Masker and Thornell Mule CNM   Anesthesia: epidural   Procedure Details:  The patient was seen in the Holding Room. The risks, benefits, complications, treatment options, and expected outcomes were discussed with the patient. The patient concurred with the proposed plan, giving informed consent. identified as Veronica Bass and the procedure verified as C-Section Delivery. A Time Out was held and the above information confirmed.  After induction of anesthesia, the patient was draped and prepped in the usual sterile manner. A transverse incision was made and carried down through the subcutaneous tissue to the fascia. Fascial incision was made in the midline and extended transversely. The fascia was separated from the underlying rectus muscle superiorly and inferiorly. The peritoneum was identified and entered. Peritoneal incision was extended longitudinally with good visualization of bowel and bladder. The utero-vesical peritoneal reflection was incised transversely and the bladder flap was bluntly freed from the lower uterine segment.  An alexsis retractor was placed in the abdomen.   A low transverse uterine incision was made. The head could not be removed from the pelvis.  A nurse put a glove on and pushed.  It still was not enough room to deliver the head.  i could not grab the feet to do a breech delivery either.  I did a T incision on the anterior aspect of the uterus.  Dr Langston Masker also cam in to assist me.  I also had to make a larger incision on the skin.  Finally the head delivered to the incision.  The vacuum was placed on the infants head to assist with  delivery.  There were three pulls no po offs and all done int he green zone.   Delivered from cephalic presentation was a  infant, with Apgar scores of 2 at one minute and 7 at five minutes. Cord ph was sent the umbilical cord was clamped and cut cord blood was obtained for evaluation. The placenta was removed Intact and appeared normal. The uterine outline, tubes and ovaries appeared normal}. The uterine incision was closed with running locked sutures of 0Vicryl. A second layer 0 vicrlyl was used to imbricate the uterine incision.  Thevertical incision on the anterior aspect of the uterus was repaired with 0 vicryl in a locked fashion.      Hemostasis was observed. Lavage was carried out until clear. The alexsis was removed.  Because the urine was bloody the bladder was back filled with sterile formula.  No formula was seen in the abdominal cavity.  Surgicel was placed over the uterine incision.  The peritoneum was closed with 0 chromic.  The muscles were examined and any bleeders were made hemostatic using bovie cautery device.   The fascia was then reapproximated with running sutures of 0 vicryl.  The subcutaneous tissue was reapproximated  With interrupted stitches using 2-0 plain gut. The subcuticular closure was performed using 3-62monocryl     Instrument, sponge, and needle counts were correct prior the abdominal closure and were correct at the conclusion of the case.    Findings: infant was delivered from vtx presentation. The fluid was moderate meconium stain.  The uterus tubes  and ovaries appeared normal.     Estimated Blood Loss:   Total IV Fluids:   Urine Output: 200CC OF bloody urine  Specimens: placenta to pathology  Complications: no complications  Disposition: PACU - hemodynamically stable.   Maternal Condition: stable   Baby condition / location:  Couplet care / Skin to Skin  Attending Attestation: I performed the procedure.   Signed: Surgeon(s): Jaymes Graff,  MD Mitchel Honour, DO

## 2015-03-27 ENCOUNTER — Encounter (HOSPITAL_COMMUNITY): Payer: Self-pay | Admitting: Obstetrics and Gynecology

## 2015-03-27 NOTE — Progress Notes (Signed)
CSW acknowledges consult for history of anxiety.  CSW attempted to meet with MOB this morning; however, MOB had numerous visitors and was sleeping. On second attempt, MOB was visiting with a friend.  CSW offered to return later, MOB agreeable.  CSW to follow up prior to discharge in order to complete psychosocial assessment.  

## 2015-03-27 NOTE — Progress Notes (Signed)
Subjective: Postpartum Day 1: Cesarean Delivery due to failure to desend  Patient up ad lib, reports no syncope or dizziness. Feeding:  breast Contraceptive plan:  unsure  Objective: Vital signs in last 24 hours: Temp:  [98.3 F (36.8 C)-98.7 F (37.1 C)] 98.3 F (36.8 C) (08/11 0514) Pulse Rate:  [75-97] 75 (08/11 0514) Resp:  [18] 18 (08/11 0514) BP: (101-114)/(49-72) 113/72 mmHg (08/11 0514) SpO2:  [96 %-100 %] 100 % (08/11 0514)  Physical Exam:  General: alert and cooperative Lochia: appropriate Uterine Fundus: firm Abdomen:  + bowel sounds, non distended Incision: no significant drainage  pressuredressing CDI DVT Evaluation: No evidence of DVT seen on physical exam., SCD in place while in the bed Homan's sign: Negative   Recent Labs  03/25/15 0920 03/26/15 0649  HGB 12.1 9.1*  HCT 35.4* 26.6*  WBC 14.5* 15.9*    Assessment: Status post Cesarean section day 1. Doing well postoperatively.  Honeycomb dressing in place, no significant drainage Anemia - hemodynamicly stable.     Plan: Continue current care. Breastfeeding and Lactation consult    Jamyra Zweig, CNM, MSN 03/27/2015. 8:15 AM

## 2015-03-28 MED ORDER — OXYCODONE-ACETAMINOPHEN 5-325 MG PO TABS: 1.0000 | ORAL_TABLET | ORAL | Status: DC | PRN

## 2015-03-28 MED ORDER — IBUPROFEN 600 MG PO TABS: 600.0000 mg | ORAL_TABLET | Freq: Four times a day (QID) | ORAL | Status: DC | PRN

## 2015-03-28 NOTE — Clinical Social Work Maternal (Signed)
  CLINICAL SOCIAL WORK MATERNAL/CHILD NOTE  Patient Details  Name: Veronica Bass MRN: [id number] Date of Birth: [birth year]  Date:  03/28/2015  Clinical Social Worker Initiating Note:  Loleta Books, LCSW Date/ Time Initiated:  03/28/15/0930     Child's Name:  [name]   Legal Guardian:  Veronica Bass and Veronica Bass  Need for Interpreter:  None   Date of Referral:  03/26/15     Reason for Referral:  History of anxiety  Referral Source:  [facility]   Address:  [address] [place], [place] [address]  Phone number:  [phone]   Household Members:  Spouse   Natural Supports (not living in the home):  Friends, Extended Family, Immediate Family   Professional Supports: None   Employment: Full-time   Type of Work:    Did not assess  Education:    N/A  Architect:  Media planner   Other Resources:    None identified  Cultural/Religious Considerations Which May Impact Care:  None reported  Strengths:  Ability to meet basic needs , Optometrist chosen , Home prepared for child    Risk Factors/Current Problems:  Mental Health Concerns : History of anxiety, no current symptoms; however,  MOB continues to adjust to her change in her birth plan.    Cognitive State:  Able to Concentrate , Alert , Goal Oriented , Insightful , Linear Thinking    Mood/Affect:  Animated, Bright , Interested , Happy    CSW Assessment:  CSW received request for consult due to MOB presenting with a history of anxiety.  MOB presented as easily engaged and receptive to the visit. MGM and FOB also in the room during the assessment and were easily engaged. MOB was noted to be smiling and appeared at ease and comfortable as she was breastfeeding the infant. MOB displayed a full range in affect, was in a pleasant mood, no notable symptoms of anxiety present in her presentation or thought process.  CSW provided support as MOB reflected upon her thoughts and feelings as she  transitions postpartum. She discussed how she is feeling "better" today than previously as she reflected upon her less than ideal birth of the infant since it was a significant change than her original birth plan. MOB shared that she is feeling better now that she has had an opportunity to cry, process her feelings, and realize that "everything happens for a reason".  MOB presented as receptive to allowing herself to grieve the loss of a vaginal birth, but expressed feelings of happiness and excitement to becoming a mother.  CSW continued to explore MOB's normative feelings of anxiety, including concern about ability to "do it all".  CSW and MOB discussed realistic expectations, and MOB recognized that it will take to readjust how she defines productivity and what is doable in one day. MOB and FOB discussed a strong support system, with friends who have recently had babies who can provide them with support.   MOB denied notable anxiety during the pregnancy, but and denied belief that her mental health is a presenting problem.  MOB presented as attentive and engaged as CSW provided education on perinatal mood and anxiety disorders, and she agreed to contact her medical provider if she notes onset of symptoms.   CSW Plan/Description:   1)Patient/Family Education: Perinatal mood and anxiety disorders 2)Information/Referral to [employer] Resources: Feelings After Birth Support Group 3)No Further Intervention Required/No Barriers to Discharge    Kelby Fam 03/28/2015, 11:28 AM

## 2015-03-28 NOTE — Discharge Instructions (Signed)

## 2015-03-28 NOTE — Lactation Note (Signed)
This note was copied from the chart of Veronica [place]. Lactation Consultation Note  Patient Name: Veronica Bass [id number] Date: 03/28/2015 Reason for consult: Follow-up assessment  Baby is 57 hours old , 7 % weight loss , Bili Check at 46 hours old 14.4 , and Serum Bili at 51 hours old 11.3. Breast feeding Range - 15 -20 mins,Latch Score 8-9 , 7 wets , 5 stools,  LC updated doc flow sheets for voids and feedings per parents. LC reviewed basics, per mom breast are getting fuller ( right >left). Per mom baby has been latching more on  the right than the left . Mom denies soreness. Sore nipple and engorgement prevention and tx reviewed , also  Referring to the baby and me booklet pages 24-25.  Baby woke up during consult , small wet diaper changed with uric acid crystals in it .  LC showed the diaper to parents and informed them of what uric crystals were ( normal for the 1st few days,  When the baby is receiving more volume with feedings should not see them.  Per mom had only been feeding in the cross cradle position. LC recommended working on the latching in football  On the side the baby hasn't been latching as much.  LC assisted mom with latching on the left breast in football, on and off pattern, and baby only latched for a short interval without swallows. More fussy . LC did note when the baby was latched for a short time the rubbing noise mom described with previous latches.  LC suspects due to moms semi flat , semi compressible areolas baby doesn't always get the depth at the breast.  LC instructed mom after breast massage , hand express, pre-pump with hand pump to make the base of the nipple and areola more elastic For a deeper latch. LC had mom do those steps , latch was improved, still on and off , and baby fussy. Changed to the right breast ( per mom  Baby seems to prefer) in football and due to baby's fussiness switched to cross cradle, on the right breast . Baby to fussy to  latch. Mom placed baby  Skin to skin for 10 mins , baby calmed down, baby then re-latched in cross cradle on the right side . Mom latched without assist and LC checked depth. No dimpling in cheeks or rubbing noise noted. Baby fed 15 mins with multiply swallows, increased with breast compressions. Nipple well rounded when  Baby released. Baby satisfied and lying on moms chest calmly. LC also instructed mom on shells between feedings , when not doing skin to skin, and not at sleep, comfort gels provided as preventive. Per mom has a DEBP at home. Per mom - their resource is Careers information officer labor doula , also LC they can consult with. LC suggested if they go home after the  3 pm Serum Bili to have her come see them tomorrow to watch a BF latch.  Mother informed of post-discharge support and given phone number to the lactation department, including services for phone call assistance; out-patient appointments; and breastfeeding support group. List of other breastfeeding resources in the community given in the handout. Encouraged mother to call for problems or concerns related to breastfeeding.    Maternal Data Has patient been taught Hand Expression?: Yes Does the patient have breastfeeding experience prior to this delivery?: No  Feeding Feeding Type: Breast Fed Length of feed: 15 min (mom latched baby independently,  no rubbing  noted )  LATCH Score/Interventions Latch: Grasps breast easily, tongue down, lips flanged, rhythmical sucking.  Audible Swallowing: Spontaneous and intermittent (improved with breast compressions )  Type of Nipple: Flat (areaolos compressible ) Intervention(s): Hand pump  Comfort (Breast/Nipple): Filling, red/small blisters or bruises, mild/mod discomfort (not engorged )  Problem noted: Filling  Hold (Positioning): No assistance needed to correctly position infant at breast. Intervention(s): Breastfeeding basics reviewed;Support Pillows;Position options;Skin to  skin  LATCH Score: 8  Lactation Tools Discussed/Used Tools: Shells;Pump;Comfort gels Shell Type: Inverted Breast pump type: Double-Electric Breast Pump Pump Review: Setup, frequency, and cleaning;Milk Storage   Consult Status Consult Status: Complete Date: 03/28/15 Follow-up type: In-patient    Kathrin Greathouse 03/28/2015, 1:03 PM

## 2015-03-28 NOTE — Discharge Summary (Signed)
  Cesarean Section Delivery Discharge Summary  Veronica Bass  DOB:    06/08/1991 MRN:    [id number] CSN:    [id number]  Date of admission:                  03/25/15  Date of discharge:                   03/28/15  Procedures this admission:  Primary LTCS with T extension on the uterus  Date of Delivery: 03/26/15  Newborn Data:  Live born female  Birth Weight: 9 lb 2.9 oz (4165 g) APGAR: 2, 7  Home with mother.  History of Present Illness:  Ms. Veronica Bass is a 24 y.o. female, G1P1001, who presents at [redacted]w[redacted]d weeks gestation. The patient has been followed at [facility] and Gynecology division of [facility]   Her pregnancy has been complicated by:  Patient Active Problem List   Diagnosis Date Noted  . Large for gestational age (LGA) 03/28/2015  . S/P primary low transverse C-section 03/26/2015    Hospital Course--Scheduled Cesarean:  Admitting Dx:  IUP at 40 2/7 weeks, early labor Rationale for C/S: Failure to descend Anesthesia:  Epidural Surgeon:  Dr. Normand Sloop, Dr. Langston Masker assisting Complications: T extension on the uterus due to difficulty delivering fetal head.  Intrapartum Procedures: cesarean: low cervical, transverse Postpartum Procedures: none Complications-Operative and Postpartum: none  Discharge Diagnoses: Term Pregnancy-delivered and failure to descend, primary LTCS with T extension on the uterus  Feeding:  breast  Contraception:  Undecided at present.  Hemoglobin Results:  CBC CBC Latest Ref Rng 03/26/2015 03/25/2015  WBC 4.0 - 10.5 K/uL 15.9(H) 14.5(H)  Hemoglobin 12.0 - 15.0 g/dL 7.8(G) 95.6  Hematocrit 36.0 - 46.0 % 26.6(L) 35.4(L)  Platelets 150 - 400 K/uL 149(L) 161      Discharge Physical Exam:   General: alert Lochia: appropriate Uterine Fundus: firm Abdomen:  + bowel sounds Incision: Honeycomb dressing CDI DVT Evaluation: No evidence of DVT seen on physical exam. Negative Homan's  sign.  Discharge Information:  Activity:           pelvic rest Diet:                routine Medications: Ibuprofen and Percocet Condition:      stable Instructions:  Discharge to: home  Follow-up Information    Follow up with [facility]. Schedule an appointment as soon as possible for a visit in 6 weeks.   Specialty:  Obstetrics and Gynecology   Why:  Call with any questions or concerns.   Contact information:   [address] [place] [address] [phone]       Nigel Bridgeman CNM 03/28/2015 9:04 AM

## 2015-10-12 ENCOUNTER — Encounter (HOSPITAL_COMMUNITY): Payer: Self-pay | Admitting: *Deleted

## 2015-10-12 ENCOUNTER — Inpatient Hospital Stay (HOSPITAL_COMMUNITY)
Admission: AD | Admit: 2015-10-12 | Discharge: 2015-10-12 | Disposition: A | Discharge status: Home / Self Care | Payer: 59 | Source: Ambulatory Visit | Attending: Obstetrics and Gynecology | Admitting: Obstetrics and Gynecology

## 2015-10-12 DIAGNOSIS — R599 Enlarged lymph nodes, unspecified: Secondary | ICD-10-CM | POA: Diagnosis not present

## 2015-10-12 DIAGNOSIS — O9089 Other complications of the puerperium, not elsewhere classified: Secondary | ICD-10-CM | POA: Insufficient documentation

## 2015-10-12 DIAGNOSIS — N644 Mastodynia: Secondary | ICD-10-CM | POA: Insufficient documentation

## 2015-10-12 DIAGNOSIS — N61 Mastitis without abscess: Secondary | ICD-10-CM

## 2015-10-12 MED ORDER — CEPHALEXIN 500 MG PO CAPS
500.0000 mg | ORAL_CAPSULE | Freq: Four times a day (QID) | ORAL | Status: AC
Start: 2015-10-12 — End: 2015-10-22

## 2015-10-12 MED ORDER — IBUPROFEN 800 MG PO TABS
800.0000 mg | ORAL_TABLET | ORAL | Status: AC
  Administered 2015-10-12: 800 mg via ORAL
  Filled 2015-10-12: qty 1

## 2015-10-12 NOTE — MAU Provider Note (Signed)
  History     Veronica, Bass 25 yo, G1P1, LMP 06/10/2014, 7 months postpartum presents to the MAU announced for breast pain, tenderness, low grade fever (99.1), malaise, and inflammed & enlarged lymph nodes in her armpits.   Pt report exclusively breastfeeding her daughter but recently went on a trip without her daughter  where she had to breast pump for a week.  Reports she acquired plugged ducts in both breasts, which has recently resolved with ressumption of Breastfeeding.  Pt is concerned she may have mastitis.     Patient Active Problem List   Diagnosis Date Noted  . Mastitis 10/12/2015  . Large for gestational age (LGA) 03/28/2015  . S/P primary low transverse C-section with T extension on uterus 03/26/2015    Chief Complaint  Patient presents with  . Breast Pain   HPI  OB History    Gravida Para Term Preterm AB TAB SAB Ectopic Multiple Living   0 1      Past Medical History  Diagnosis Date  . Headache   . Anxiety   . Seasonal allergies   . Heart murmur     hx of heart murmur  . History of UTI     Past Surgical History  Procedure Laterality Date  . Cesarean section N/A 03/26/2015    Procedure: CESAREAN SECTION;  Surgeon: Jaymes Graff, MD;  Location: [facility] ORS;  Service: Obstetrics;  Laterality: N/A;    History reviewed. No pertinent family history.  Social History  Substance Use Topics  . Smoking status: Never Smoker   . Smokeless tobacco: None  . Alcohol Use: No    Allergies: No Known Allergies  Prescriptions prior to admission  Medication Sig Dispense Refill Last Dose  . acetaminophen (TYLENOL) 500 MG tablet Take 500-1,000 mg by mouth every 6 (six) hours as needed for mild pain or headache.   10/12/2015 at 0415  . cetirizine (ZYRTEC) 10 MG tablet Take 10 mg by mouth daily.   10/12/2015 at Unknown time    ROS Physical Exam   Blood pressure 118/78, pulse 82, temperature 98.6 F (37 C), temperature source Oral, resp. rate 18, last menstrual  period 06/10/2014, unknown if currently breastfeeding.    Physical Exam  Respiratory: Right breast exhibits tenderness. Left breast exhibits tenderness.  Lymphadenopathy:    She has axillary adenopathy.       Right axillary: Lateral adenopathy present.       Left axillary: Lateral adenopathy present.  Bilateral inflammation of axillary lymph nodes, red, raised and painful to the touch.     ED Course  Assessment: 25yo, G1P1, 7months pp Breastfeeding Probable mastitis Enlarged bilateral axillary lymph nodes Bilateral breast tender, warm to touch Afebrile   Plan:  Consult with lactation declined- pt's doula is an IBCLC, will follow up with her Keflex  QID for 10 days Call if symptoms worsen    Alphonzo Severance CNM, MSN 10/12/2015 6:16 PM

## 2015-10-12 NOTE — Discharge Instructions (Signed)
Breastfeeding and Mastitis Mastitis is inflammation of the breast tissue. It can occur in women who are breastfeeding. This can make breastfeeding painful. Mastitis will sometimes go away on its own. Your health care provider will help determine if treatment is needed. CAUSES Mastitis is often associated with a blocked milk (lactiferous) duct. This can happen when too much milk builds up in the breast. Causes of excess milk in the breast can include:  Poor latch-on. If your baby is not latched onto the breast properly, she or he may not empty your breast completely while breastfeeding.  Allowing too much time to pass between feedings.  Wearing a bra or other clothing that is too tight. This puts extra pressure on the lactiferous ducts so milk does not flow through them as it should. Mastitis can also be caused by a bacterial infection. Bacteria may enter the breast tissue through cuts or openings in the skin. In women who are breastfeeding, this may occur because of cracked or irritated skin. Cracks in the skin are often caused when your baby does not latch on properly to the breast. SIGNS AND SYMPTOMS  Swelling, redness, tenderness, and pain in an area of the breast.  Swelling of the glands under the arm on the same side.  Fever may or may not accompany mastitis. If an infection is allowed to progress, a collection of pus (abscess) may develop. DIAGNOSIS  Your health care provider can usually diagnose mastitis based on your symptoms and a physical exam. Tests may be done to help confirm the diagnosis. These may include:  Removal of pus from the breast by applying pressure to the area. This pus can be examined in the lab to determine which bacteria are present. If an abscess has developed, the fluid in the abscess can be removed with a needle. This can also be used to confirm the diagnosis and determine the bacteria present. In most cases, pus will not be present.  Blood tests to determine if  your body is fighting a bacterial infection.  Mammogram or ultrasound tests to rule out other problems or diseases. TREATMENT  Mastitis that occurs with breastfeeding will sometimes go away on its own. Your health care provider may choose to wait 24 hours after first seeing you to decide whether a prescription medicine is needed. If your symptoms are worse after 24 hours, your health care provider will likely prescribe an antibiotic medicine to treat the mastitis. He or she will determine which bacteria are most likely causing the infection and will then select an appropriate antibiotic medicine. This is sometimes changed based on the results of tests performed to identify the bacteria, or if there is no response to the antibiotic medicine selected. Antibiotic medicines are usually given by mouth. You may also be given medicine for pain. HOME CARE INSTRUCTIONS  Only take over-the-counter or prescription medicines for pain, fever, or discomfort as directed by your health care provider.  If your health care provider prescribed an antibiotic medicine, take the medicine as directed. Make sure you finish it even if you start to feel better.  Do not wear a tight or underwire bra. Wear a soft, supportive bra.  Increase your fluid intake, especially if you have a fever.  Continue to empty the breast. Your health care provider can tell you whether this milk is safe for your infant or needs to be thrown out. You may be told to stop nursing until your health care provider thinks it is safe for your baby.   Use a breast pump if you are advised to stop nursing.  Keep your nipples clean and dry.  Empty the first breast completely before going to the other breast. If your baby is not emptying your breasts completely for some reason, use a breast pump to empty your breasts.  If you go back to work, pump your breasts while at work to stay in time with your nursing schedule.  Avoid allowing your breasts to become  overly filled with milk (engorged). SEEK MEDICAL CARE IF:  You have pus-like discharge from the breast.  Your symptoms do not improve with the treatment prescribed by your health care provider within 2 days. SEEK IMMEDIATE MEDICAL CARE IF:  Your pain and swelling are getting worse.  You have pain that is not controlled with medicine.  You have a red line extending from the breast toward your armpit.  You have a fever or persistent symptoms for more than 2-3 days.  You have a fever and your symptoms suddenly get worse. MAKE SURE YOU:   Understand these instructions.  Will watch your condition.  Will get help right away if you are not doing well or get worse.   This information is not intended to replace advice given to you by your health care provider. Make sure you discuss any questions you have with your health care provider.   Document Released: 11/27/2004 Document Revised: 08/07/2013 Document Reviewed: 03/08/2013 Elsevier Interactive Patient Education 2016 [employer] Inc[employer]  

## 2015-10-12 NOTE — MAU Note (Signed)
Pt states the baby was born 03/26/2015.  Pt states she went out of town late Sunday and was gone through Tuesday.  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  Pt states she got home late Tuesday night and she tried to have her daughter nurse on the right breast and it did not fully unclog the duct but it helped.  She fed on that breast again during the night.  The next morning she fed on both breast and during the second morning feeding she made sure to use the left breast again to make sure it was empty.  Pt states she thought they were unclogged but then she started feeling tenderness under her arms.  Pt states she just switched to a new deodorant so she thought that was causing the tenderness so she stopped shaving and using the deodorant.  It did not help.  Her lymph nodes started to swell on the left and then on the right.  By Friday her armpits were super painful and she started to feel under the weather.  She states she had chills and aches yesterday with no energy.  Pt states her skin everywhere because really sensitive.  Last night her fever got up to 99.15F.  Pt states that the areas around the clogged ducts are still very tender but not feel like a knot anymore.

## 2015-10-12 NOTE — Progress Notes (Signed)
Veronica Bass, CNM notified of pt arrival to MAU and pt complaints.  States she will come see the pt.

## 2016-12-16 LAB — OB RESULTS CONSOLE RUBELLA ANTIBODY, IGM: RUBELLA: IMMUNE

## 2016-12-16 LAB — OB RESULTS CONSOLE HEPATITIS B SURFACE ANTIGEN: HEP B S AG: NEGATIVE

## 2016-12-16 LAB — OB RESULTS CONSOLE GC/CHLAMYDIA
CHLAMYDIA, DNA PROBE: NEGATIVE
GC PROBE AMP, GENITAL: NEGATIVE

## 2016-12-16 LAB — OB RESULTS CONSOLE ANTIBODY SCREEN: ANTIBODY SCREEN: NEGATIVE

## 2016-12-16 LAB — OB RESULTS CONSOLE HIV ANTIBODY (ROUTINE TESTING): HIV: NONREACTIVE

## 2016-12-16 LAB — OB RESULTS CONSOLE ABO/RH: RH TYPE: POSITIVE

## 2016-12-16 LAB — OB RESULTS CONSOLE RPR: RPR: NONREACTIVE

## 2017-06-06 ENCOUNTER — Other Ambulatory Visit: Payer: Self-pay | Admitting: Obstetrics

## 2017-06-06 LAB — OB RESULTS CONSOLE GBS: GBS: NEGATIVE

## 2017-06-22 ENCOUNTER — Telehealth (HOSPITAL_COMMUNITY): Payer: Self-pay | Admitting: *Deleted

## 2017-06-22 ENCOUNTER — Encounter (HOSPITAL_COMMUNITY): Payer: Self-pay | Admitting: *Deleted

## 2017-06-22 NOTE — Telephone Encounter (Signed)
Preadmission screen  

## 2017-06-23 ENCOUNTER — Telehealth (HOSPITAL_COMMUNITY): Payer: Self-pay | Admitting: *Deleted

## 2017-06-23 NOTE — Telephone Encounter (Signed)
Preadmission screen  

## 2017-06-24 ENCOUNTER — Encounter (HOSPITAL_COMMUNITY): Payer: Self-pay

## 2017-07-01 ENCOUNTER — Encounter (HOSPITAL_COMMUNITY)
Admission: RE | Admit: 2017-07-01 | Discharge: 2017-07-01 | Disposition: A | Discharge status: Home / Self Care | Payer: Self-pay | Source: Ambulatory Visit | Attending: Obstetrics | Admitting: Obstetrics

## 2017-07-01 DIAGNOSIS — Z01812 Encounter for preprocedural laboratory examination: Secondary | ICD-10-CM | POA: Insufficient documentation

## 2017-07-01 LAB — CBC
HEMATOCRIT: 32.9 % — AB (ref 36.0–46.0)
HEMOGLOBIN: 10.9 g/dL — AB (ref 12.0–15.0)
MCH: 30.5 pg (ref 26.0–34.0)
MCHC: 33.1 g/dL (ref 30.0–36.0)
MCV: 92.2 fL (ref 78.0–100.0)
PLATELETS: 144 10*3/uL — AB (ref 150–400)
RBC: 3.57 MIL/uL — AB (ref 3.87–5.11)
RDW: 13.3 % (ref 11.5–15.5)
WBC: 9.1 10*3/uL (ref 4.0–10.5)

## 2017-07-01 LAB — TYPE AND SCREEN
ABO/RH(D): O POS
Antibody Screen: NEGATIVE

## 2017-07-01 NOTE — Patient Instructions (Signed)
Ardeen FillersKaitlin Molder  07/01/2017   Your procedure is scheduled on:  07/04/2017  Enter through the Main Entrance of [facility] at 0530 AM.  Pick up the phone at the desk and dial [phone]  Call this number if you have problems the morning of surgery:[phone]  Remember:   Do not eat food:After Midnight.  Do not drink clear liquids: After Midnight.  Take these medicines the morning of surgery with A SIP OF WATER: none   Do not wear jewelry, make-up or nail polish.  Do not wear lotions, powders, or perfumes. Do not wear deodorant.  Do not shave 48 hours prior to surgery.  Do not bring valuables to the hospital.  [facility] is not   responsible for any belongings or valuables brought to the hospital.  Contacts, dentures or bridgework may not be worn into surgery.  Leave suitcase in the car. After surgery it may be brought to your room.  For patients admitted to the hospital, checkout time is 11:00 AM the day of              discharge.    N/A   Please read over the following fact sheets that you were given:   Surgical Site Infection Prevention

## 2017-07-02 LAB — RPR: RPR: NONREACTIVE

## 2017-07-03 NOTE — Anesthesia Preprocedure Evaluation (Addendum)
Anesthesia Evaluation  Patient identified by MRN, date of birth, ID band Patient awake    Reviewed: Allergy & Precautions, NPO status , Patient's Chart, lab work & pertinent test results  History of Anesthesia Complications Negative for: history of anesthetic complications  Airway Mallampati: II  TM Distance: >3 FB Neck ROM: Full    Dental no notable dental hx. (+) Dental Advisory Given   Pulmonary neg pulmonary ROS,    Pulmonary exam normal        Cardiovascular Exercise Tolerance: Good negative cardio ROS Normal cardiovascular exam     Neuro/Psych negative neurological ROS     GI/Hepatic negative GI ROS, Neg liver ROS,   Endo/Other  negative endocrine ROS  Renal/GU negative Renal ROS     Musculoskeletal negative musculoskeletal ROS (+)   Abdominal   Peds  Hematology negative hematology ROS (+) anemia ,   Anesthesia Other Findings   Reproductive/Obstetrics (+) Pregnancy                            Anesthesia Physical  Anesthesia Plan  ASA: II  Anesthesia Plan: Spinal   Post-op Pain Management:    Induction:   PONV Risk Score and Plan: Dexamethasone, Ondansetron and Scopolamine patch - Pre-op  Airway Management Planned: Natural Airway  Additional Equipment:   Intra-op Plan:   Post-operative Plan:   Informed Consent: I have reviewed the patients History and Physical, chart, labs and discussed the procedure including the risks, benefits and alternatives for the proposed anesthesia with the patient or authorized representative who has indicated his/her understanding and acceptance.   Dental advisory given  Plan Discussed with: CRNA and Anesthesiologist  Anesthesia Plan Comments:        Anesthesia Quick Evaluation

## 2017-07-04 ENCOUNTER — Encounter (HOSPITAL_COMMUNITY)
Admission: RE | Disposition: A | Discharge status: Home / Self Care | Payer: Self-pay | Source: Ambulatory Visit | Attending: Obstetrics

## 2017-07-04 ENCOUNTER — Inpatient Hospital Stay (HOSPITAL_COMMUNITY): Payer: Self-pay | Admitting: Anesthesiology

## 2017-07-04 ENCOUNTER — Encounter (HOSPITAL_COMMUNITY): Payer: Self-pay

## 2017-07-04 ENCOUNTER — Inpatient Hospital Stay (HOSPITAL_COMMUNITY)
Admission: RE | Admit: 2017-07-04 | Discharge: 2017-07-06 | DRG: 787 | Disposition: A | Discharge status: Home / Self Care | Payer: Self-pay | Source: Ambulatory Visit | Attending: Obstetrics | Admitting: Obstetrics

## 2017-07-04 DIAGNOSIS — K219 Gastro-esophageal reflux disease without esophagitis: Secondary | ICD-10-CM | POA: Diagnosis present

## 2017-07-04 DIAGNOSIS — Z3A39 39 weeks gestation of pregnancy: Secondary | ICD-10-CM

## 2017-07-04 DIAGNOSIS — O34211 Maternal care for low transverse scar from previous cesarean delivery: Principal | ICD-10-CM | POA: Diagnosis present

## 2017-07-04 DIAGNOSIS — O9902 Anemia complicating childbirth: Secondary | ICD-10-CM | POA: Diagnosis not present

## 2017-07-04 DIAGNOSIS — D62 Acute posthemorrhagic anemia: Secondary | ICD-10-CM | POA: Diagnosis not present

## 2017-07-04 DIAGNOSIS — Z98891 History of uterine scar from previous surgery: Secondary | ICD-10-CM

## 2017-07-04 DIAGNOSIS — O9962 Diseases of the digestive system complicating childbirth: Secondary | ICD-10-CM | POA: Diagnosis present

## 2017-07-04 LAB — COMPREHENSIVE METABOLIC PANEL
ALBUMIN: 2.7 g/dL — AB (ref 3.5–5.0)
ALK PHOS: 90 U/L (ref 38–126)
ALT: 10 U/L — ABNORMAL LOW (ref 14–54)
AST: 17 U/L (ref 15–41)
Anion gap: 7 (ref 5–15)
BILIRUBIN TOTAL: 0.4 mg/dL (ref 0.3–1.2)
BUN: 11 mg/dL (ref 6–20)
CALCIUM: 8.3 mg/dL — AB (ref 8.9–10.3)
CO2: 21 mmol/L — ABNORMAL LOW (ref 22–32)
Chloride: 108 mmol/L (ref 101–111)
Creatinine, Ser: 0.59 mg/dL (ref 0.44–1.00)
GFR calc Af Amer: >60 mL/min (ref >60)
GLUCOSE: 85 mg/dL (ref 65–99)
Potassium: 4 mmol/L (ref 3.5–5.1)
Sodium: 136 mmol/L (ref 135–145)
TOTAL PROTEIN: 6 g/dL — AB (ref 6.5–8.1)

## 2017-07-04 LAB — CBC
HCT: 32.5 % — ABNORMAL LOW (ref 36.0–46.0)
Hemoglobin: 10.7 g/dL — ABNORMAL LOW (ref 12.0–15.0)
MCH: 30.1 pg (ref 26.0–34.0)
MCHC: 32.9 g/dL (ref 30.0–36.0)
MCV: 91.3 fL (ref 78.0–100.0)
PLATELETS: 154 10*3/uL (ref 150–400)
RBC: 3.56 MIL/uL — ABNORMAL LOW (ref 3.87–5.11)
RDW: 13.2 % (ref 11.5–15.5)
WBC: 8.7 10*3/uL (ref 4.0–10.5)

## 2017-07-04 SURGERY — Surgical Case
Anesthesia: Spinal

## 2017-07-04 MED ORDER — SENNOSIDES-DOCUSATE SODIUM 8.6-50 MG PO TABS
2.0000 | ORAL_TABLET | ORAL | Status: DC
  Administered 2017-07-05 (×2): 2 via ORAL
  Filled 2017-07-04 (×2): qty 2

## 2017-07-04 MED ORDER — ONDANSETRON HCL 4 MG/2ML IJ SOLN
INTRAMUSCULAR | Status: DC | PRN
  Administered 2017-07-04: 4 mg via INTRAVENOUS

## 2017-07-04 MED ORDER — IBUPROFEN 600 MG PO TABS
600.0000 mg | ORAL_TABLET | Freq: Four times a day (QID) | ORAL | Status: DC
  Administered 2017-07-04 – 2017-07-06 (×9): 600 mg via ORAL
  Filled 2017-07-04 (×8): qty 1

## 2017-07-04 MED ORDER — DIPHENHYDRAMINE HCL 25 MG PO CAPS: 25.0000 mg | ORAL_CAPSULE | Freq: Four times a day (QID) | ORAL | Status: DC | PRN

## 2017-07-04 MED ORDER — SODIUM CHLORIDE 0.9% FLUSH: 3.0000 mL | INTRAVENOUS | Status: DC | PRN

## 2017-07-04 MED ORDER — SIMETHICONE 80 MG PO CHEW
80.0000 mg | CHEWABLE_TABLET | Freq: Three times a day (TID) | ORAL | Status: DC
  Administered 2017-07-04 – 2017-07-06 (×5): 80 mg via ORAL
  Filled 2017-07-04 (×5): qty 1

## 2017-07-04 MED ORDER — OXYCODONE-ACETAMINOPHEN 5-325 MG PO TABS
1.0000 | ORAL_TABLET | ORAL | Status: DC | PRN
  Administered 2017-07-05 – 2017-07-06 (×5): 1 via ORAL
  Filled 2017-07-04 (×5): qty 1

## 2017-07-04 MED ORDER — ACETAMINOPHEN 325 MG PO TABS
650.0000 mg | ORAL_TABLET | ORAL | Status: DC | PRN
  Administered 2017-07-04 (×2): 650 mg via ORAL
  Filled 2017-07-04 (×2): qty 2

## 2017-07-04 MED ORDER — COCONUT OIL OIL
1.0000 "application " | TOPICAL_OIL | Status: DC | PRN
  Administered 2017-07-05: 1 via TOPICAL
  Filled 2017-07-04: qty 120

## 2017-07-04 MED ORDER — SCOPOLAMINE 1 MG/3DAYS TD PT72
1.0000 | MEDICATED_PATCH | Freq: Once | TRANSDERMAL | Status: DC
  Administered 2017-07-04: 1.5 mg via TRANSDERMAL

## 2017-07-04 MED ORDER — NALBUPHINE HCL 10 MG/ML IJ SOLN: 5.0000 mg | Freq: Once | INTRAMUSCULAR | Status: DC | PRN

## 2017-07-04 MED ORDER — LACTATED RINGERS IV SOLN
INTRAVENOUS | Status: DC | PRN
  Administered 2017-07-04: 40 [IU] via INTRAVENOUS

## 2017-07-04 MED ORDER — LACTATED RINGERS IV SOLN: INTRAVENOUS | Status: DC

## 2017-07-04 MED ORDER — MORPHINE SULFATE (PF) 0.5 MG/ML IJ SOLN
INTRAMUSCULAR | Status: DC | PRN
  Administered 2017-07-04: .2 mg via INTRAVENOUS

## 2017-07-04 MED ORDER — ZOLPIDEM TARTRATE 5 MG PO TABS: 5.0000 mg | ORAL_TABLET | Freq: Every evening | ORAL | Status: DC | PRN

## 2017-07-04 MED ORDER — NALBUPHINE HCL 10 MG/ML IJ SOLN: 5.0000 mg | INTRAMUSCULAR | Status: DC | PRN

## 2017-07-04 MED ORDER — PRENATAL MULTIVITAMIN CH
1.0000 | ORAL_TABLET | Freq: Every day | ORAL | Status: DC
  Administered 2017-07-05: 1 via ORAL
  Filled 2017-07-04: qty 1

## 2017-07-04 MED ORDER — METOCLOPRAMIDE HCL 5 MG/ML IJ SOLN
INTRAMUSCULAR | Status: DC | PRN
  Administered 2017-07-04: 10 mg via INTRAVENOUS

## 2017-07-04 MED ORDER — DIPHENHYDRAMINE HCL 50 MG/ML IJ SOLN: 12.5000 mg | INTRAMUSCULAR | Status: DC | PRN

## 2017-07-04 MED ORDER — MEPERIDINE HCL 25 MG/ML IJ SOLN: 6.2500 mg | INTRAMUSCULAR | Status: DC | PRN

## 2017-07-04 MED ORDER — TETANUS-DIPHTH-ACELL PERTUSSIS 5-2.5-18.5 LF-MCG/0.5 IM SUSP: 0.5000 mL | Freq: Once | INTRAMUSCULAR | Status: DC

## 2017-07-04 MED ORDER — BUPIVACAINE IN DEXTROSE 0.75-8.25 % IT SOLN
INTRATHECAL | Status: AC
  Filled 2017-07-04: qty 2

## 2017-07-04 MED ORDER — ONDANSETRON HCL 4 MG/2ML IJ SOLN: 4.0000 mg | Freq: Three times a day (TID) | INTRAMUSCULAR | Status: DC | PRN

## 2017-07-04 MED ORDER — HYDROMORPHONE HCL 1 MG/ML IJ SOLN
0.2500 mg | INTRAMUSCULAR | Status: DC | PRN
  Administered 2017-07-04: 0.25 mg via INTRAVENOUS

## 2017-07-04 MED ORDER — WITCH HAZEL-GLYCERIN EX PADS
1.0000 | MEDICATED_PAD | CUTANEOUS | Status: DC | PRN
Start: 2017-07-04 — End: 2017-07-06

## 2017-07-04 MED ORDER — FENTANYL CITRATE (PF) 100 MCG/2ML IJ SOLN
INTRAMUSCULAR | Status: AC
  Filled 2017-07-04: qty 2

## 2017-07-04 MED ORDER — OXYTOCIN 10 UNIT/ML IJ SOLN
INTRAMUSCULAR | Status: AC
  Filled 2017-07-04: qty 4

## 2017-07-04 MED ORDER — HYDROMORPHONE HCL 1 MG/ML IJ SOLN
INTRAMUSCULAR | Status: AC
  Filled 2017-07-04: qty 0.5

## 2017-07-04 MED ORDER — PROMETHAZINE HCL 25 MG/ML IJ SOLN: 6.2500 mg | INTRAMUSCULAR | Status: DC | PRN

## 2017-07-04 MED ORDER — PHENYLEPHRINE 8 MG IN D5W 100 ML (0.08MG/ML) PREMIX OPTIME
INJECTION | INTRAVENOUS | Status: DC | PRN
  Administered 2017-07-04: 60 ug/min via INTRAVENOUS

## 2017-07-04 MED ORDER — LACTATED RINGERS IV SOLN
INTRAVENOUS | Status: DC
  Administered 2017-07-04: 08:00:00 via INTRAVENOUS

## 2017-07-04 MED ORDER — BUPIVACAINE IN DEXTROSE 0.75-8.25 % IT SOLN
INTRATHECAL | Status: DC | PRN
  Administered 2017-07-04: 1.6 mL via INTRATHECAL

## 2017-07-04 MED ORDER — DIBUCAINE 1 % RE OINT: 1.0000 "application " | TOPICAL_OINTMENT | RECTAL | Status: DC | PRN

## 2017-07-04 MED ORDER — LACTATED RINGERS IV SOLN
INTRAVENOUS | Status: DC
  Administered 2017-07-04: 22:00:00 via INTRAVENOUS

## 2017-07-04 MED ORDER — NALOXONE HCL 0.4 MG/ML IJ SOLN
1.0000 ug/kg/h | INTRAVENOUS | Status: DC | PRN
  Filled 2017-07-04: qty 5

## 2017-07-04 MED ORDER — GUAIFENESIN-CODEINE 100-10 MG/5ML PO SOLN: 10.0000 mL | ORAL | Status: DC | PRN

## 2017-07-04 MED ORDER — SCOPOLAMINE 1 MG/3DAYS TD PT72
MEDICATED_PATCH | TRANSDERMAL | Status: AC
Start: 2017-07-04 — End: 2017-07-04
  Filled 2017-07-04: qty 1

## 2017-07-04 MED ORDER — SIMETHICONE 80 MG PO CHEW
80.0000 mg | CHEWABLE_TABLET | ORAL | Status: DC | PRN
  Filled 2017-07-04: qty 1

## 2017-07-04 MED ORDER — PHENYLEPHRINE HCL 10 MG/ML IJ SOLN
INTRAMUSCULAR | Status: DC | PRN
  Administered 2017-07-04 (×3): 80 ug via INTRAVENOUS

## 2017-07-04 MED ORDER — MENTHOL 3 MG MT LOZG: 1.0000 | LOZENGE | OROMUCOSAL | Status: DC | PRN

## 2017-07-04 MED ORDER — DEXAMETHASONE SODIUM PHOSPHATE 4 MG/ML IJ SOLN
INTRAMUSCULAR | Status: AC
  Filled 2017-07-04: qty 1

## 2017-07-04 MED ORDER — SERTRALINE HCL 25 MG PO TABS
25.0000 mg | ORAL_TABLET | Freq: Every day | ORAL | Status: DC
  Administered 2017-07-04 – 2017-07-06 (×3): 25 mg via ORAL
  Filled 2017-07-04 (×4): qty 1

## 2017-07-04 MED ORDER — METOCLOPRAMIDE HCL 5 MG/ML IJ SOLN
INTRAMUSCULAR | Status: AC
  Filled 2017-07-04: qty 2

## 2017-07-04 MED ORDER — ONDANSETRON HCL 4 MG/2ML IJ SOLN
INTRAMUSCULAR | Status: AC
  Filled 2017-07-04: qty 2

## 2017-07-04 MED ORDER — FENTANYL CITRATE (PF) 100 MCG/2ML IJ SOLN
INTRAMUSCULAR | Status: DC | PRN
  Administered 2017-07-04: 10 ug via INTRATHECAL

## 2017-07-04 MED ORDER — DEXAMETHASONE SODIUM PHOSPHATE 4 MG/ML IJ SOLN
INTRAMUSCULAR | Status: DC | PRN
  Administered 2017-07-04: 4 mg via INTRAVENOUS

## 2017-07-04 MED ORDER — LACTATED RINGERS IV SOLN
INTRAVENOUS | Status: DC | PRN
  Administered 2017-07-04 (×2): via INTRAVENOUS

## 2017-07-04 MED ORDER — MORPHINE SULFATE (PF) 0.5 MG/ML IJ SOLN
INTRAMUSCULAR | Status: AC
  Filled 2017-07-04: qty 10

## 2017-07-04 MED ORDER — DIPHENHYDRAMINE HCL 25 MG PO CAPS
25.0000 mg | ORAL_CAPSULE | ORAL | Status: DC | PRN
Start: 2017-07-04 — End: 2017-07-06

## 2017-07-04 MED ORDER — PANTOPRAZOLE SODIUM 40 MG PO TBEC
40.0000 mg | DELAYED_RELEASE_TABLET | Freq: Every day | ORAL | Status: DC
  Administered 2017-07-04 – 2017-07-06 (×2): 40 mg via ORAL
  Filled 2017-07-04 (×2): qty 1

## 2017-07-04 MED ORDER — SIMETHICONE 80 MG PO CHEW
80.0000 mg | CHEWABLE_TABLET | ORAL | Status: DC
  Administered 2017-07-05 (×2): 80 mg via ORAL
  Filled 2017-07-04: qty 1

## 2017-07-04 MED ORDER — OXYTOCIN 40 UNITS IN LACTATED RINGERS INFUSION - SIMPLE MED: 2.5000 [IU]/h | INTRAVENOUS | Status: DC

## 2017-07-04 MED ORDER — CEFAZOLIN SODIUM-DEXTROSE 2-4 GM/100ML-% IV SOLN
2.0000 g | INTRAVENOUS | Status: AC
  Administered 2017-07-04: 2 g via INTRAVENOUS
  Filled 2017-07-04: qty 100

## 2017-07-04 MED ORDER — PHENYLEPHRINE 8 MG IN D5W 100 ML (0.08MG/ML) PREMIX OPTIME
INJECTION | INTRAVENOUS | Status: AC
  Filled 2017-07-04: qty 100

## 2017-07-04 MED ORDER — NALOXONE HCL 0.4 MG/ML IJ SOLN
0.4000 mg | INTRAMUSCULAR | Status: DC | PRN
Start: 2017-07-04 — End: 2017-07-06

## 2017-07-04 SURGICAL SUPPLY — 37 items
BENZOIN TINCTURE PRP APPL 2/3 (GAUZE/BANDAGES/DRESSINGS) ×3 IMPLANT
CHLORAPREP W/TINT 26ML (MISCELLANEOUS) ×3 IMPLANT
CLAMP CORD UMBIL (MISCELLANEOUS) IMPLANT
CLOSURE WOUND 1/2 X4 (GAUZE/BANDAGES/DRESSINGS) ×1
CLOTH BEACON ORANGE TIMEOUT ST (SAFETY) ×3 IMPLANT
CONTAINER PREFILL 10% NBF 15ML (MISCELLANEOUS) IMPLANT
DRAPE C SECTION CLR SCREEN (DRAPES) ×3 IMPLANT
DRSG OPSITE POSTOP 4X10 (GAUZE/BANDAGES/DRESSINGS) ×3 IMPLANT
ELECT REM PT RETURN 9FT ADLT (ELECTROSURGICAL) ×3
ELECTRODE REM PT RTRN 9FT ADLT (ELECTROSURGICAL) ×1 IMPLANT
EXTRACTOR VACUUM M CUP 4 TUBE (SUCTIONS) IMPLANT
EXTRACTOR VACUUM M CUP 4' TUBE (SUCTIONS)
GLOVE BIO SURGEON STRL SZ 6.5 (GLOVE) ×2 IMPLANT
GLOVE BIO SURGEONS STRL SZ 6.5 (GLOVE) ×1
GLOVE BIOGEL PI IND STRL 7.0 (GLOVE) ×4 IMPLANT
GLOVE BIOGEL PI INDICATOR 7.0 (GLOVE) ×8
GOWN STRL REUS W/TWL LRG LVL3 (GOWN DISPOSABLE) ×6 IMPLANT
KIT ABG SYR 3ML LUER SLIP (SYRINGE) IMPLANT
NEEDLE HYPO 22GX1.5 SAFETY (NEEDLE) IMPLANT
NEEDLE HYPO 25X5/8 SAFETYGLIDE (NEEDLE) IMPLANT
NS IRRIG 1000ML POUR BTL (IV SOLUTION) ×3 IMPLANT
PACK C SECTION WH (CUSTOM PROCEDURE TRAY) ×3 IMPLANT
PAD OB MATERNITY 4.3X12.25 (PERSONAL CARE ITEMS) ×3 IMPLANT
PENCIL SMOKE EVAC W/HOLSTER (ELECTROSURGICAL) ×3 IMPLANT
STRIP CLOSURE SKIN 1/2X4 (GAUZE/BANDAGES/DRESSINGS) ×2 IMPLANT
SUT MON AB 4-0 PS1 27 (SUTURE) ×3 IMPLANT
SUT PLAIN 0 NONE (SUTURE) IMPLANT
SUT PLAIN 2 0 XLH (SUTURE) IMPLANT
SUT PROLENE 1 CT (SUTURE) ×3 IMPLANT
SUT VIC AB 0 CT1 36 (SUTURE) ×6 IMPLANT
SUT VIC AB 0 CTX 36 (SUTURE) ×4
SUT VIC AB 0 CTX36XBRD ANBCTRL (SUTURE) ×2 IMPLANT
SUT VIC AB 2-0 CT1 27 (SUTURE) ×2
SUT VIC AB 2-0 CT1 TAPERPNT 27 (SUTURE) ×1 IMPLANT
SYR CONTROL 10ML LL (SYRINGE) IMPLANT
TOWEL OR 17X24 6PK STRL BLUE (TOWEL DISPOSABLE) ×3 IMPLANT
TRAY FOLEY BAG SILVER LF 14FR (SET/KITS/TRAYS/PACK) ×3 IMPLANT

## 2017-07-04 NOTE — Addendum Note (Signed)
Addendum  created 07/04/17 1355 by Elgie CongoMalinova, Chelsia Serres H, CRNA   Sign clinical note

## 2017-07-04 NOTE — Progress Notes (Signed)
Patient doing well with pain at this time and has taken tylenol and ibuprofen; however, there are no orders for percocet or oxycodone for later this evening when patient able to take if needed. Called Marlinda Mikeanya Bailey for orders. See new orders of percocet. Earl Galasborne, Linda HedgesStefanie ToveyHudspeth

## 2017-07-04 NOTE — Anesthesia Postprocedure Evaluation (Signed)
Anesthesia Post Note  Patient: Veronica Bass  Procedure(s) Performed: Repeat CESAREAN SECTION (N/A )     Patient location during evaluation: Mother Baby Anesthesia Type: Spinal Level of consciousness: awake and alert and oriented Pain management: pain level controlled Vital Signs Assessment: post-procedure vital signs reviewed and stable Respiratory status: spontaneous breathing and nonlabored ventilation Cardiovascular status: stable Postop Assessment: no headache, no apparent nausea or vomiting, no backache, adequate PO intake, spinal receding and patient able to bend at knees Anesthetic complications: no    Last Vitals:  Vitals:   07/04/17 1130 07/04/17 1230  BP: 111/66 100/72  Pulse: 60 62  Resp: 16 18  Temp: 36.5 C 36.5 C  SpO2: 96% 97%    Last Pain:  Vitals:   07/04/17 1230  TempSrc: Oral  PainSc: 2    Pain Goal: Patients Stated Pain Goal: 3 (07/04/17 1130)               Donnalee CurryMalinova,Shela Esses Hristova

## 2017-07-04 NOTE — Anesthesia Procedure Notes (Signed)
Spinal  Patient location during procedure: OR Start time: 07/04/2017 7:37 AM End time: 07/04/2017 7:38 AM Staffing Anesthesiologist: Heather RobertsSinger, James, MD Resident/CRNA: Graciela HusbandsFussell, Lorali Khamis O, CRNA Performed: other anesthesia staff  Preanesthetic Checklist Completed: patient identified, surgical consent and IV checked Spinal Block Patient position: sitting Prep: DuraPrep Patient monitoring: heart rate, continuous pulse ox and blood pressure Approach: midline Location: L3-4 Injection technique: single-shot Needle Needle type: Pencan  Needle gauge: 24 G Needle length: 10 cm Assessment Sensory level: T4 Additional Notes Placed by B. Blake DivineLackey SRNA

## 2017-07-04 NOTE — H&P (Signed)
Veronica FillersKaitlin Bass is a 26 y.o. G2P1001 at 2378w1d presenting for RCS. Pt notes rare contractions . Good fetal movement, No vaginal bleeding, not leaking fluid .  [facility] since 1st trimester wks - h/o PCS for arrest of descent with T-d incision, plan RCS -URI, possible broken rib in past wk - GERD H/o PP depression, plan PP sertraline   Prenatal Transfer Tool  Maternal Diabetes: No Genetic Screening: Normal Maternal Ultrasounds/Referrals: Normal Fetal Ultrasounds or other Referrals:  None Maternal Substance Abuse:  No Significant Maternal Medications:  None Significant Maternal Lab Results: None     OB History    Gravida Para Term Preterm AB Living   2 1 1     1    SAB TAB Ectopic Multiple Live Births         0 1     Past Medical History:  Diagnosis Date  . Anxiety   . Headache   . Heart murmur    hx of heart murmur  . History of UTI   . Seasonal allergies    Past Surgical History:  Procedure Laterality Date  . CESAREAN SECTION N/A 03/26/2015   Performed by Jaymes Graffillard, Naima, MD at [facility]   Family History: family history includes Heart disease in her paternal grandfather; Stroke in her paternal grandmother. Social History:  reports that  has never smoked. she has never used smokeless tobacco. She reports that she does not drink alcohol or use drugs.  Review of Systems - Negative except rib pain     Blood pressure 115/75, pulse (!) 102, temperature 98.2 F (36.8 C), temperature source Oral, resp. rate 16, height 5\' 6"  (1.676 m), weight 82.6 kg (182 lb), unknown if currently breastfeeding.  Physical Exam:  Gen: well appearing, no distress  Back: no CVAT Abd: gravid, NT, no RUQ pain LE: no edema, equal bilaterally, non-tender   Prenatal labs: ABO, Rh: --/--/O POS (11/16 1005) Antibody: NEG (11/16 1005) Rubella: Immune (05/03 0000) RPR: Non Reactive (11/16 1005)  HBsAg: Negative (05/03 0000)  HIV: Non-reactive (05/03 0000)  GBS: Negative (10/22  0000)  1 hr Glucola 105  Genetic screening nl quad Anatomy US nl   Assessment/Plan: 26 y.o. G2P1001 at 2478w1d RCS, R/B reviewed with pt Possible broken rib, care with delivery, plan CXR PP H/o PP depression, plan sertraline   Veronica Bass 07/04/2017, 7:21 AM

## 2017-07-04 NOTE — Transfer of Care (Signed)
Immediate Anesthesia Transfer of Care Note  Patient: Veronica Bass  Procedure(s) Performed: Repeat CESAREAN SECTION (N/A )  Patient Location: PACU  Anesthesia Type:Spinal  Level of Consciousness: awake, alert  and oriented  Airway & Oxygen Therapy: Patient Spontanous Breathing  Post-op Assessment: Report given to RN and Post -op Vital signs reviewed and stable  Post vital signs: Reviewed and stable  Last Vitals:  Vitals:   07/04/17 0603  BP: 115/75  Pulse: (!) 102  Resp: 16  Temp: 36.8 C    Last Pain:  Vitals:   07/04/17 0603  TempSrc: Oral         Complications: No apparent anesthesia complications

## 2017-07-04 NOTE — Brief Op Note (Addendum)
07/04/2017  8:33 AM  PATIENT:  Veronica Bass  26 y.o. female  PRE-OPERATIVE DIAGNOSIS:  Previous Cesarean Section  POST-OPERATIVE DIAGNOSIS:  Previous Cesarean Section  PROCEDURE:  Procedure(s) with comments: Repeat CESAREAN SECTION (N/A) - EDD: 07/10/17  LTCS with 2 layer closure  SURGEON:  Surgeon(s) and Role:    Noland Fordyce* Lakeia Bradshaw, MD - Primary  PHYSICIAN ASSISTANT:   ASSISTANTSFredric Mare: Bailey, CNM   ANESTHESIA:   spinal  EBL:  565 mL   BLOOD ADMINISTERED:none  DRAINS: Urinary Catheter (Foley)   LOCAL MEDICATIONS USED:  NONE  SPECIMEN:  Source of Specimen:  placenta  DISPOSITION OF SPECIMEN:  L&D  COUNTS:  YES  TOURNIQUET:  * No tourniquets in log *  DICTATION: .Note written in EPIC  PLAN OF CARE: Admit to inpatient   PATIENT DISPOSITION:  PACU - hemodynamically stable.   Delay start of Pharmacological VTE agent (>24hrs) due to surgical blood loss or risk of bleeding: yes

## 2017-07-04 NOTE — Lactation Note (Signed)
This note was copied from a baby's chart. Lactation Consultation Note  Patient Name: Veronica Bass'XToday's Date: 07/04/2017 Reason for consult: Initial assessment   P2, Ex BF 15 months. Mother states she had hand expressed and viewed drops. Baby latched in cradle hold upon entering at end of feeding. Intermittent sucks and swallows observed. Nipple round and evert when baby unlatched. Discussed basics.  Mother denies questions or concerns. Baby has been having lots of long feedings.   Mom encouraged to feed baby 8-12 times/24 hours and with feeding cues.  Mom made aware of O/P services, breastfeeding support groups, community resources, and our phone # for post-discharge questions.     Maternal Data Has patient been taught Hand Expression?: Yes Does the patient have breastfeeding experience prior to this delivery?: Yes  Feeding Feeding Type: Breast Fed Length of feed: 25 min  LATCH Score                   Interventions Interventions: Breast feeding basics reviewed  Lactation Tools Discussed/Used     Consult Status Consult Status: PRN    Hardie PulleyBerkelhammer, Ruth Boschen 07/04/2017, 9:49 PM

## 2017-07-04 NOTE — Op Note (Signed)
07/04/2017  8:33 AM  PATIENT:  Veronica Bass  26 y.o. female  PRE-OPERATIVE DIAGNOSIS:  Previous Cesarean Section  POST-OPERATIVE DIAGNOSIS:  Previous Cesarean Section  PROCEDURE:  Procedure(s) with comments: Repeat CESAREAN SECTION (N/A) - EDD: 07/10/17  LTCS with 2 layer closure  SURGEON:  Surgeon(s) and Role:    Noland Fordyce* Hampton Wixom, MD - Primary  PHYSICIAN ASSISTANT:   ASSISTANTSFredric Mare: Bailey, CNM   ANESTHESIA:   spinal  EBL:  565 mL   BLOOD ADMINISTERED:none  DRAINS: Urinary Catheter (Foley)   LOCAL MEDICATIONS USED:  NONE  SPECIMEN:  Source of Specimen:  placenta  DISPOSITION OF SPECIMEN:  L&D  COUNTS:  YES  TOURNIQUET:  * No tourniquets in log *  DICTATION: .Note written in EPIC  PLAN OF CARE: Admit to inpatient   PATIENT DISPOSITION:  PACU - hemodynamically stable.   Delay start of Pharmacological VTE agent (>24hrs) due to surgical blood loss or risk of bleeding: yes     Findings:  @BABYSEXEBC @ infant,  APGAR (1 MIN): 10   APGAR (5 MINS): 10   APGAR (10 MINS):   Normal uterus, tubes and ovaries, normal placenta. 3VC, clear amniotic fluid  EBL: 565 cc Antibiotics:   2g Ancef Complications: none  Indications: This is a 26 y.o. year-old, g2P1  At 1944w1d admitted for RCS. Risks benefits and alternatives of the procedure were discussed with the patient who agreed to proceed  Procedure:  After informed consent was obtained the patient was taken to the operating room where spinal anesthesia was initiated.  She was prepped and draped in the normal sterile fashion in dorsal supine position with a leftward tilt.  A foley catheter was in place.  A Pfannenstiel skin incision was made 2 cm above the pubic symphysis in the midline with the scalpel.  Dissection was carried down with the Bovie cautery until the fascia was reached. The fascia was incised in the midline. The incision was extended laterally with the Mayo scissors. The inferior aspect of the fascial incision  was grasped with the Coker clamps, elevated up and the underlying rectus muscles were dissected off sharply. The superior aspect of the fascial incision was grasped with the Coker clamps elevated up and the underlying rectus muscles were dissected off sharply.  The peritoneum was entered sharlpy. The peritoneal incision was extended superiorly and inferiorly with good visualization of the bladder. The bladder blade was inserted and palpation was done to assess the fetal position and the location of the uterine vessels.A bladder flap was created sharply.  The lower segment of the uterus was incised sharply with the scalpel and extended  bluntly in the cephalo-caudal fashion. The infant was grasped, brought to the incision,  rotated and the infant was delivered with fundal pressure. The nose and mouth were bulb suctioned. The cord was clamped and cut after 1 minute delay. The infant was handed off to the waiting pediatrician. The placenta was expressed. The uterus was left in situ.  The uterus was cleared of all clots and debris. The uterine incision was repaired with 0 Vicryl in a running locked fashion.  A second layer of the same suture was used in an imbricating fashion to obtain excellent hemostasis. The gutters were cleared of all clots and debris. The uterine incision was reinspected and found to be hemostatic. Tubes and ovaries were inspected and normal.  The peritoneum was grasped and closed with 2-0 Vicryl in a running fashion. The cut muscle edges and the underside of the fascia  were inspected and found to be hemostatic. The fascia was closed with 0 Vicryl in two halves. The subcutaneous tissue was irrigated. Scarpa's layer was closed with a 2-0 plain gut suture. The skin was closed with a 4-0 Monocryl in a single layer. The patient tolerated the procedure well. Sponge lap and needle counts were correct x3 and patient was taken to the recovery room in a stable condition.  [name] Hargun Spurling 07/04/2017 8:34  AM

## 2017-07-04 NOTE — Anesthesia Postprocedure Evaluation (Signed)
Anesthesia Post Note  Patient: Veronica FillersKaitlin Hauger  Procedure(s) Performed: Repeat CESAREAN SECTION (N/A )     Patient location during evaluation: PACU Anesthesia Type: Spinal Level of consciousness: awake and alert Pain management: pain level controlled Vital Signs Assessment: post-procedure vital signs reviewed and stable Respiratory status: spontaneous breathing and respiratory function stable Cardiovascular status: blood pressure returned to baseline and stable Postop Assessment: spinal receding Anesthetic complications: no    Last Vitals:  Vitals:   07/04/17 0945 07/04/17 1000  BP: 106/73 114/77  Pulse: 71 66  Resp: 18 16  Temp: (!) 36.1 C   SpO2:      Last Pain:  Vitals:   07/04/17 1023  TempSrc:   PainSc: 1    Pain Goal:                 Mekai Wilkinson DANIEL

## 2017-07-05 DIAGNOSIS — D62 Acute posthemorrhagic anemia: Secondary | ICD-10-CM

## 2017-07-05 LAB — CBC
HEMATOCRIT: 29.1 % — AB (ref 36.0–46.0)
HEMOGLOBIN: 9.8 g/dL — AB (ref 12.0–15.0)
MCH: 30.8 pg (ref 26.0–34.0)
MCHC: 33.7 g/dL (ref 30.0–36.0)
MCV: 91.5 fL (ref 78.0–100.0)
Platelets: 132 10*3/uL — ABNORMAL LOW (ref 150–400)
RBC: 3.18 MIL/uL — ABNORMAL LOW (ref 3.87–5.11)
RDW: 13.1 % (ref 11.5–15.5)
WBC: 9.2 10*3/uL (ref 4.0–10.5)

## 2017-07-05 LAB — BIRTH TISSUE RECOVERY COLLECTION (PLACENTA DONATION)

## 2017-07-05 MED ORDER — POLYSACCHARIDE IRON COMPLEX 150 MG PO CAPS
150.0000 mg | ORAL_CAPSULE | Freq: Every day | ORAL | Status: DC
  Administered 2017-07-05 – 2017-07-06 (×2): 150 mg via ORAL
  Filled 2017-07-05 (×2): qty 1

## 2017-07-05 MED ORDER — MAGNESIUM OXIDE 400 (241.3 MG) MG PO TABS
400.0000 mg | ORAL_TABLET | Freq: Every day | ORAL | Status: DC
  Administered 2017-07-05 – 2017-07-06 (×2): 400 mg via ORAL
  Filled 2017-07-05 (×3): qty 1

## 2017-07-05 NOTE — Progress Notes (Signed)
POSTOPERATIVE DAY # 1 S/P Elective Repeat LTCS, baby boy   S:         Reports feeling well             Tolerating po intake / no nausea / no vomiting / + flatus / no BM  Denies dizziness, SOB, or CP             Bleeding is light             Pain controlled with Motrin and Percocet             Up ad lib / ambulatory/ voiding QS  Newborn breast feeding - reports baby is cluster feeding, going well / Circumcision - planning today    O:  VS: BP 99/66 (BP Location: Right Arm)   Pulse 69   Temp 98.4 F (36.9 C) (Oral)   Resp 18   Ht 5\' 6"  (1.676 m)   Wt 82.6 kg (182 lb)   SpO2 98%   Breastfeeding? Unknown   BMI 29.38 kg/m    LABS:               Recent Labs    07/04/17 0606 07/05/17 0506  WBC 8.7 9.2  HGB 10.7* 9.8*  PLT 154 132*               Bloodtype: --/--/O POS (11/16 1005)  Rubella: Immune (05/03 0000)                                             I&O: Intake/Output      11/19 0701 - 11/20 0700 11/20 0701 - 11/21 0700   P.O. 600    I.V. (mL/kg) 2462.5 (29.8)    Total Intake(mL/kg) 3062.5 (37.1)    Urine (mL/kg/hr) 1200 (0.6)    Blood 665    Total Output 1865    Net +1197.5                      Physical Exam:             Alert and Oriented X3  Lungs: Clear and unlabored  Heart: regular rate and rhythm / no murmurs  Abdomen: soft, non-tender, non-distended, active bowel sounds in all quadrants             Fundus: firm, non-tender, U-2             Dressing honeycomb dsg with steri strips c/d/i              Incision:  approximated with sutures / no erythema / no ecchymosis / no drainage  Perineum: intact  Lochia: appropriate, no clots   Extremities: no edema, no calf pain or tenderness,   A:        POD # 1 S/P Elective repeat LTCS             ABL Anemia compounding chronic anemia  P:        Routine postoperative care              Niferex 150mg  daily  Magnesium oxide 400mg  daily   May shower today   See lactation today  Anticipate early discharge home  tomorrow   Carlean JewsMeredith Sigmon, MSN, CNM [facility]

## 2017-07-05 NOTE — Progress Notes (Signed)

## 2017-07-05 NOTE — Plan of Care (Signed)
Progressing. Up and walking well.

## 2017-07-05 NOTE — Plan of Care (Signed)
Progressing appropriately.

## 2017-07-06 MED ORDER — MAGNESIUM OXIDE 400 (241.3 MG) MG PO TABS: 400.0000 mg | ORAL_TABLET | Freq: Every day | ORAL | 1 refills | Status: DC

## 2017-07-06 MED ORDER — POLYSACCHARIDE IRON COMPLEX 150 MG PO CAPS: 150.0000 mg | ORAL_CAPSULE | Freq: Every day | ORAL | 1 refills | Status: DC

## 2017-07-06 MED ORDER — OXYCODONE-ACETAMINOPHEN 5-325 MG PO TABS: 1.0000 | ORAL_TABLET | Freq: Four times a day (QID) | ORAL | 0 refills | Status: DC | PRN

## 2017-07-06 MED ORDER — OXYCODONE-ACETAMINOPHEN 5-325 MG PO TABS
1.0000 | ORAL_TABLET | ORAL | Status: DC | PRN
  Administered 2017-07-06: 1 via ORAL
  Filled 2017-07-06: qty 1

## 2017-07-06 MED ORDER — IBUPROFEN 600 MG PO TABS: 600.0000 mg | ORAL_TABLET | Freq: Four times a day (QID) | ORAL | 0 refills | Status: DC

## 2017-07-06 NOTE — Discharge Summary (Signed)
OB Discharge Summary  Patient Name: Veronica Bass DOB: [birth year] MRN: [id number]  Date of admission: 07/04/2017 Delivering MD: Noland FordyceFOGLEMAN, Kitt Ledet   Date of discharge: 07/06/2017  Admitting diagnosis: Previous Cesarean Section Intrauterine pregnancy: 2657w1d     Secondary diagnosis:Principal Problem:   Postpartum care following cesarean delivery (11/19) Active Problems:   Status post repeat low transverse cesarean section   Acute on chronic blood loss anemia (HCC)  Additional problems:none     Discharge diagnosis: Term Pregnancy Delivered                                                                     Post partum procedures:none  Augmentation: RCS  Complications: None  Hospital course:  Sceduled C/S   26 y.o. yo G2P2002 at 6057w1d was admitted to the hospital 07/04/2017 for scheduled cesarean section with the following indication:Elective Repeat.  Membrane Rupture Time/Date: 7:58 AM ,07/04/2017   Patient delivered a Viable infant.07/04/2017  Details of operation can be found in separate operative note.  Pateint had an uncomplicated postpartum course.  She is ambulating, tolerating a regular diet, passing flatus, and urinating well. Patient is discharged home in stable condition on  07/06/17         Physical exam  Vitals:   07/05/17 0559 07/05/17 0900 07/05/17 1731 07/06/17 0530  BP: 99/66 95/70 110/71 109/74  Pulse: 69 83 78 77  Resp: 18 16 18 18   Temp: 98.4 F (36.9 C) 98.5 F (36.9 C) 98.1 F (36.7 C) 97.9 F (36.6 C)  TempSrc: Oral Oral Oral Oral  SpO2: 98% 99%  99%  Weight:      Height:       General: alert Lochia: appropriate Uterine Fundus: firm Incision: Healing well with no significant drainage DVT Evaluation: No evidence of DVT seen on physical exam. Labs: Lab Results  Component Value Date   WBC 9.2 07/05/2017   HGB 9.8 (L) 07/05/2017   HCT 29.1 (L) 07/05/2017   MCV 91.5 07/05/2017   PLT 132 (L) 07/05/2017   CMP Latest Ref Rng & Units  07/04/2017  Glucose 65 - 99 mg/dL 85  BUN 6 - 20 mg/dL 11  Creatinine 7.820.44 - 9.561.00 mg/dL 2.130.59  Sodium 086135 - 578145 mmol/L 136  Potassium 3.5 - 5.1 mmol/L 4.0  Chloride 101 - 111 mmol/L 108  CO2 22 - 32 mmol/L 21(L)  Calcium 8.9 - 10.3 mg/dL 8.3(L)  Total Protein 6.5 - 8.1 g/dL 6.0(L)  Total Bilirubin 0.3 - 1.2 mg/dL 0.4  Alkaline Phos 38 - 126 U/L 90  AST 15 - 41 U/L 17  ALT 14 - 54 U/L 10(L)    Discharge instruction: per After Visit Summary and "Baby and Me Booklet".  After Visit Meds:  Allergies as of 07/06/2017   No Known Allergies     Medication List    STOP taking these medications   azithromycin 250 MG tablet Commonly known as:  ZITHROMAX     TAKE these medications   acetaminophen 325 MG tablet Commonly known as:  TYLENOL Take 650 mg every 6 (six) hours as needed by mouth for mild pain or headache.   guaiFENesin-codeine 100-10 MG/5ML syrup Take 10 mLs every 4 (four) hours as needed by mouth for  cough.   ibuprofen 600 MG tablet Commonly known as:  ADVIL,MOTRIN Take 1 tablet (600 mg total) by mouth every 6 (six) hours.   iron polysaccharides 150 MG capsule Commonly known as:  NIFEREX Take 1 capsule (150 mg total) by mouth daily.   magnesium oxide 400 (241.3 Mg) MG tablet Commonly known as:  MAG-OX Take 1 tablet (400 mg total) by mouth daily.   oxyCODONE-acetaminophen 5-325 MG tablet Commonly known as:  PERCOCET/ROXICET Take 1-2 tablets by mouth every 6 (six) hours as needed for moderate pain or severe pain.   pantoprazole 40 MG tablet Commonly known as:  PROTONIX Take 40 mg daily by mouth.   PRENATAL PO Take 1 tablet daily by mouth.       Diet: routine diet  Activity: Advance as tolerated. Pelvic rest for 6 weeks.   Outpatient follow up:6 weeks Follow up Appt:No future appointments. Follow up visit: No Follow-up on file.  Postpartum contraception: Not Discussed  Newborn Data: Live born female  Birth Weight: 9 lb 1.2 oz (4115 g) APGAR: 10,  10  Newborn Delivery   Birth date/time:  07/04/2017 07:59:00 Delivery type:  C-Section, Low Transverse C-section categorization:  Repeat     Baby Feeding: Breast Disposition:home with mother   07/06/2017 [name] Giavonni Cizek, MD

## 2017-08-03 ENCOUNTER — Encounter (HOSPITAL_COMMUNITY): Payer: Self-pay | Admitting: Obstetrics

## 2018-10-06 LAB — OB RESULTS CONSOLE RUBELLA ANTIBODY, IGM: Rubella: IMMUNE

## 2018-10-06 LAB — OB RESULTS CONSOLE HEPATITIS B SURFACE ANTIGEN: Hepatitis B Surface Ag: NEGATIVE

## 2018-10-06 LAB — OB RESULTS CONSOLE HIV ANTIBODY (ROUTINE TESTING): HIV: NONREACTIVE

## 2018-10-06 LAB — OB RESULTS CONSOLE RPR: RPR: NONREACTIVE

## 2018-10-16 ENCOUNTER — Other Ambulatory Visit (HOSPITAL_COMMUNITY): Payer: Self-pay | Admitting: Obstetrics

## 2018-10-24 ENCOUNTER — Other Ambulatory Visit (HOSPITAL_COMMUNITY): Payer: Self-pay | Admitting: Obstetrics

## 2018-10-24 DIAGNOSIS — O30032 Twin pregnancy, monochorionic/diamniotic, second trimester: Secondary | ICD-10-CM

## 2018-10-24 DIAGNOSIS — Z3689 Encounter for other specified antenatal screening: Secondary | ICD-10-CM

## 2018-10-24 DIAGNOSIS — Z3A19 19 weeks gestation of pregnancy: Secondary | ICD-10-CM

## 2018-11-30 ENCOUNTER — Other Ambulatory Visit (HOSPITAL_COMMUNITY): Payer: Self-pay | Admitting: Obstetrics

## 2018-11-30 ENCOUNTER — Other Ambulatory Visit (HOSPITAL_COMMUNITY): Payer: Self-pay | Admitting: *Deleted

## 2018-11-30 ENCOUNTER — Encounter (HOSPITAL_COMMUNITY): Payer: Self-pay | Admitting: *Deleted

## 2018-11-30 DIAGNOSIS — O30031 Twin pregnancy, monochorionic/diamniotic, first trimester: Secondary | ICD-10-CM

## 2018-12-04 ENCOUNTER — Ambulatory Visit (HOSPITAL_COMMUNITY)
Admission: RE | Admit: 2018-12-04 | Payer: Self-pay | Source: Ambulatory Visit | Attending: Obstetrics | Admitting: Obstetrics

## 2018-12-04 ENCOUNTER — Ambulatory Visit (HOSPITAL_COMMUNITY): Payer: Self-pay | Attending: Obstetrics and Gynecology

## 2018-12-04 ENCOUNTER — Encounter (HOSPITAL_COMMUNITY): Payer: Self-pay

## 2018-12-04 ENCOUNTER — Ambulatory Visit (HOSPITAL_COMMUNITY): Payer: Self-pay

## 2018-12-05 ENCOUNTER — Other Ambulatory Visit (HOSPITAL_COMMUNITY): Payer: Self-pay | Admitting: Obstetrics

## 2018-12-05 DIAGNOSIS — Z3689 Encounter for other specified antenatal screening: Secondary | ICD-10-CM

## 2018-12-05 DIAGNOSIS — Z3A21 21 weeks gestation of pregnancy: Secondary | ICD-10-CM

## 2018-12-05 DIAGNOSIS — O30032 Twin pregnancy, monochorionic/diamniotic, second trimester: Secondary | ICD-10-CM

## 2018-12-06 ENCOUNTER — Ambulatory Visit (HOSPITAL_COMMUNITY): Payer: Self-pay

## 2018-12-20 ENCOUNTER — Ambulatory Visit (HOSPITAL_COMMUNITY)
Admission: RE | Admit: 2018-12-20 | Discharge: 2018-12-20 | Disposition: A | Discharge status: Home / Self Care | Payer: Self-pay | Source: Ambulatory Visit | Attending: Obstetrics and Gynecology | Admitting: Obstetrics and Gynecology

## 2018-12-20 ENCOUNTER — Other Ambulatory Visit: Payer: Self-pay

## 2018-12-20 ENCOUNTER — Encounter (HOSPITAL_COMMUNITY): Payer: Self-pay

## 2018-12-20 ENCOUNTER — Ambulatory Visit (HOSPITAL_COMMUNITY): Payer: Self-pay | Admitting: *Deleted

## 2018-12-20 ENCOUNTER — Encounter (HOSPITAL_COMMUNITY): Payer: Self-pay | Admitting: Obstetrics

## 2018-12-20 VITALS — Temp 98.1°F

## 2018-12-20 DIAGNOSIS — Z3689 Encounter for other specified antenatal screening: Secondary | ICD-10-CM

## 2018-12-20 DIAGNOSIS — Z363 Encounter for antenatal screening for malformations: Secondary | ICD-10-CM

## 2018-12-20 DIAGNOSIS — O30032 Twin pregnancy, monochorionic/diamniotic, second trimester: Secondary | ICD-10-CM

## 2018-12-20 DIAGNOSIS — Z3A21 21 weeks gestation of pregnancy: Secondary | ICD-10-CM

## 2018-12-20 DIAGNOSIS — O0990 Supervision of high risk pregnancy, unspecified, unspecified trimester: Secondary | ICD-10-CM

## 2018-12-20 DIAGNOSIS — O359XX0 Maternal care for (suspected) fetal abnormality and damage, unspecified, not applicable or unspecified: Secondary | ICD-10-CM

## 2019-02-25 ENCOUNTER — Inpatient Hospital Stay (HOSPITAL_COMMUNITY)
Admission: AD | Admit: 2019-02-25 | Discharge: 2019-02-25 | Disposition: A | Discharge status: Home / Self Care | Payer: Self-pay | Attending: Obstetrics and Gynecology | Admitting: Obstetrics and Gynecology

## 2019-02-25 ENCOUNTER — Other Ambulatory Visit: Payer: Self-pay

## 2019-02-25 ENCOUNTER — Inpatient Hospital Stay (HOSPITAL_BASED_OUTPATIENT_CLINIC_OR_DEPARTMENT_OTHER): Payer: Self-pay

## 2019-02-25 ENCOUNTER — Encounter (HOSPITAL_COMMUNITY): Payer: Self-pay | Admitting: *Deleted

## 2019-02-25 DIAGNOSIS — O368132 Decreased fetal movements, third trimester, fetus 2: Secondary | ICD-10-CM | POA: Insufficient documentation

## 2019-02-25 DIAGNOSIS — Z3A30 30 weeks gestation of pregnancy: Secondary | ICD-10-CM | POA: Insufficient documentation

## 2019-02-25 DIAGNOSIS — O30033 Twin pregnancy, monochorionic/diamniotic, third trimester: Secondary | ICD-10-CM

## 2019-02-25 DIAGNOSIS — Z3689 Encounter for other specified antenatal screening: Secondary | ICD-10-CM

## 2019-02-25 LAB — URINALYSIS, ROUTINE W REFLEX MICROSCOPIC
Bilirubin Urine: NEGATIVE
Glucose, UA: NEGATIVE mg/dL
Hgb urine dipstick: NEGATIVE
Ketones, ur: NEGATIVE mg/dL
Nitrite: NEGATIVE
Protein, ur: NEGATIVE mg/dL
Specific Gravity, Urine: 1.017 (ref 1.005–1.030)
pH: 7 (ref 5.0–8.0)

## 2019-02-25 NOTE — MAU Note (Addendum)
Veronica Bass is a 28 y.o. at [redacted]w[redacted]d here in MAU reporting:  Decreased fetal movement Onset of complaint: started last night Reports only feeling 1 movement last night and this morning from [name]. Endorses lots of fetal movement [name].  Tried drinking juice at home. Has eaten a meal today consisting of eggs, toast, and a banana.  Call the on call prior to coming in. Pain score: denies Denies vaginal bleeding or LOF.  Vitals:   02/25/19 0814  BP: 106/64  Pulse: (!) 111  Resp: 17  Temp: 97.6 F (36.4 C)  SpO2: 98%     FHT: [name]: 156  [name]: 169 Lab orders placed from triage: ua

## 2019-02-25 NOTE — MAU Provider Note (Signed)
History   [id number]   Chief Complaint  Patient presents with  . Decreased Fetal Movement    HPI Veronica Bass is a 28 y.o. female  G3P2002 here with report of decreased fetal movement of [name] since yesterday.  Reports feeling the baby move approximately 4 times in the past 24 hour.  Denies vaginal bleeding or leaking of fluid.  Feels occasional contraction. Feels good movement of [name].   Patient's last menstrual period was 07/24/2018.  OB History  Gravida Para Term Preterm AB Living  3 2 2     2   SAB TAB Ectopic Multiple Live Births        0 2    # Outcome Date GA Lbr Len/2nd Weight Sex Delivery Anes PTL Lv  3 Current           2 Term 07/04/17 [redacted]w[redacted]d  4115 g M CS-LTranv Spinal  LIV  1 Term 03/26/15 [redacted]w[redacted]d / 06:12 4165 g F CS-Vac EPI  LIV    Past Medical History:  Diagnosis Date  . Anxiety   . Headache   . Heart murmur    hx of heart murmur  . History of UTI   . Seasonal allergies     Family History  Problem Relation Age of Onset  . Stroke Paternal Grandmother   . Heart disease Paternal Grandfather     Social History   Socioeconomic History  . Marital status: Married    Spouse name: Not on file  . Number of children: Not on file  . Years of education: Not on file  . Highest education level: Not on file  Occupational History  . Not on file  Social Needs  . Financial resource strain: Not on file  . Food insecurity    Worry: Not on file    Inability: Not on file  . Transportation needs    Medical: Not on file    Non-medical: Not on file  Tobacco Use  . Smoking status: Never Smoker  . Smokeless tobacco: Never Used  Substance and Sexual Activity  . Alcohol use: No  . Drug use: No  . Sexual activity: Yes  Lifestyle  . Physical activity    Days per week: Not on file    Minutes per session: Not on file  . Stress: Not on file  Relationships  . Social Musician on phone: Not on file    Gets together: Not on file    Attends religious  service: Not on file    Active member of club or organization: Not on file    Attends meetings of clubs or organizations: Not on file    Relationship status: Not on file  Other Topics Concern  . Not on file  Social History Narrative  . Not on file    No Known Allergies  No current facility-administered medications on file prior to encounter.    Current Outpatient Medications on File Prior to Encounter  Medication Sig Dispense Refill  . acetaminophen (TYLENOL) 325 MG tablet Take 650 mg every 6 (six) hours as needed by mouth for mild pain or headache.     . guaiFENesin-codeine 100-10 MG/5ML syrup Take 10 mLs every 4 (four) hours as needed by mouth for cough.  0  . ibuprofen (ADVIL,MOTRIN) 600 MG tablet Take 1 tablet (600 mg total) by mouth every 6 (six) hours. (Patient not taking: Reported on 12/20/2018) 30 tablet 0  . iron polysaccharides (NIFEREX) 150 MG capsule Take 1  capsule (150 mg total) by mouth daily. (Patient not taking: Reported on 12/20/2018) 30 capsule 1  . magnesium oxide (MAG-OX) 400 (241.3 Mg) MG tablet Take 1 tablet (400 mg total) by mouth daily. (Patient not taking: Reported on 12/20/2018) 30 tablet 1  . oxyCODONE-acetaminophen (PERCOCET/ROXICET) 5-325 MG tablet Take 1-2 tablets by mouth every 6 (six) hours as needed for moderate pain or severe pain. (Patient not taking: Reported on 12/20/2018) 30 tablet 0  . pantoprazole (PROTONIX) 40 MG tablet Take 40 mg daily by mouth.  3  . Prenatal Vit-Fe Fumarate-FA (PRENATAL PO) Take 1 tablet daily by mouth.       Review of Systems  Constitutional: Negative.   Gastrointestinal: Negative.   Genitourinary: Negative.      Physical Exam   Vitals:   02/25/19 0801 02/25/19 0814 02/25/19 1056  BP:  106/64 113/71  Pulse:  (!) 111 78  Resp:  17   Temp:  97.6 F (36.4 C)   TempSrc:  Oral   SpO2:  98%   Weight: 82.1 kg      Physical Exam  Nursing note and vitals reviewed. Constitutional: She appears well-developed and  well-nourished. No distress.  Respiratory: Effort normal. No respiratory distress.  GI: Soft. There is no abdominal tenderness.  Skin: She is not diaphoretic.   Fetal Tracing: [name] Baseline: 140 Variability: moderate Accelerations: 15x15 Decelerations: none  [name] Baseline: 135 Variability: moderate Accelerations: 15x15 Decelerations: variable x 1  Toco: irregular UI & ctx x1    MAU Course  Procedures Results for orders placed or performed during the hospital encounter of 02/25/19 (from the past 24 hour(s))  Urinalysis, Routine w reflex microscopic     Status: Abnormal   Collection Time: 02/25/19  8:01 AM  Result Value Ref Range   Color, Urine YELLOW YELLOW   APPearance HAZY (A) CLEAR   Specific Gravity, Urine 1.017 1.005 - 1.030   pH 7.0 5.0 - 8.0   Glucose, UA NEGATIVE NEGATIVE mg/dL   Hgb urine dipstick NEGATIVE NEGATIVE   Bilirubin Urine NEGATIVE NEGATIVE   Ketones, ur NEGATIVE NEGATIVE mg/dL   Protein, ur NEGATIVE NEGATIVE mg/dL   Nitrite NEGATIVE NEGATIVE   Leukocytes,Ua MODERATE (A) NEGATIVE   RBC / HPF 0-5 0 - 5 RBC/hpf   WBC, UA 6-10 0 - 5 WBC/hpf   Bacteria, UA RARE (A) NONE SEEN   Squamous Epithelial / LPF 11-20 0 - 5   Mucus PRESENT    [name] Mfm Fetal Bpp Wo Non Stress  Result Date: 02/25/2019 ----------------------------------------------------------------------  OBSTETRICS REPORT                       (Signed Final 02/25/2019 01:36 pm) ---------------------------------------------------------------------- Patient Info  ID #:       [id number]                          D.O.B.:  11/21/1990 (27 yrs)  Name:       Veronica Bass                 Visit Date: 02/25/2019 09:47 am ---------------------------------------------------------------------- Performed By  Performed By:     Birdena Crandall        Ref. Address:     [address]                    [place]  [address]                                                              [place], [place]                                                             [id number]  Attending:        Noralee Spaceavi Shankar MD        [facility]  Referred By:      [facility] ---------------------------------------------------------------------- Orders   #  Description                          Code         Ordered By   1  US MFM FETAL BPP WO NON              76819.01     Aubreanna Percle      STRESS   2  US MFM FETAL BPP WO NST              [id number]      Judeth HornERIN Latorria Zeoli      ADDL GESTATION  ----------------------------------------------------------------------   #  Order #                    Accession #                 Episode #   1  [id number]                  [id number]                  [id number]   2  [id number]                  [id number]                  [id number]  ---------------------------------------------------------------------- Indications   Decreased fetal movements, third trimester,    O36.8130   unspecified   Twin pregnancy, mono/di, second trimester      O30.032   [redacted] weeks gestation of pregnancy                Z3A.30  ---------------------------------------------------------------------- Fetal Evaluation (Fetus A)  Num Of Fetuses:         2  Fetal Heart Rate(bpm):  135  Cardiac Activity:       Observed  Fetal Lie:              Maternal right side  Presentation:           Breech, footling  Placenta:  Posterior  P. Cord Insertion:      Visualized, central  Amniotic Fluid  AFI FV:      Subjectively within normal limits                              Largest Pocket(cm)                              3.95 ---------------------------------------------------------------------- Biophysical Evaluation (Fetus A)  Amniotic F.V:   Within normal limits       F. Tone:        Observed  F. Movement:    Observed                   Score:          8/8  F. Breathing:   Observed  ---------------------------------------------------------------------- OB History  Gravidity:    3         Term:   2        Prem:   0        SAB:   0  TOP:          0       Ectopic:  0        Living: 20 ---------------------------------------------------------------------- Gestational Age (Fetus A)  LMP:           30w 6d        Date:  07/24/18                 EDD:   04/30/19  Best:          Georgiann Hahn 6d     Det. By:  LMP  (07/24/18)          EDD:   04/30/19 ---------------------------------------------------------------------- Anatomy (Fetus A)  Ventricles:            Appears normal         Abdominal Wall:         Appears nml (cord                                                                        insert, abd wall)  Heart:                 Appears normal         Kidneys:                Appear normal                         (4CH, axis, and                         situs)  Stomach:               Appears normal, left   Bladder:                Appears normal                         sided ---------------------------------------------------------------------- Fetal Evaluation (Fetus B)  Num Of Fetuses:         2  Fetal Heart Rate(bpm):  119  Cardiac Activity:       Observed  Fetal Lie:              Maternal left side  Presentation:           Cephalic  Placenta:               Posterior  P. Cord Insertion:      Not well visualized  Amniotic Fluid  AFI FV:      Subjectively within normal limits                              Largest Pocket(cm)                              3.61 ---------------------------------------------------------------------- Biophysical Evaluation (Fetus B)  Amniotic F.V:   Within normal limits       F. Tone:        Observed  F. Movement:    Observed                   Score:          8/8  F. Breathing:   Observed ---------------------------------------------------------------------- Gestational Age (Fetus B)  LMP:           30w 6d        Date:  07/24/18                 EDD:   04/30/19  Best:          Georgiann Hahn 6d      Det. By:  LMP  (07/24/18)          EDD:   04/30/19 ---------------------------------------------------------------------- Anatomy (Fetus B)  Heart:                 Appears normal         Abdominal Wall:         Appears nml (cord                         (4CH, axis, and                                insert, abd wall)                         situs)  RVOT:                  Appears normal         Kidneys:                Appear normal  LVOT:                  Appears normal         Bladder:                Appears normal  Stomach:               Appears normal, left                         sided ---------------------------------------------------------------------- Impression  Monochorionic-diamniotic twin pregnancy.  Twin A:  Maternal right, breech footling, posterior placenta.  Amniotic fluid is normal and good fetal activity is seen. Fetal  bladder appears normal. Antenatal testing is reassuring. BPP  8/8.  Twin B: Maternal left, cephalic, posterior placenta. Amniotic  fluid is normal and good fetal activity is seen. Fetal bladder  appears normal. Antenatal testing is reassuring. BPP 8/8.  No evidence of twin-to-twin transfusion syndrome. ----------------------------------------------------------------------                  Tama High, MD Electronically Signed Final Report   02/25/2019 01:36 pm ----------------------------------------------------------------------  US Fetal Bpp Wo Nst Addl Gestation  Result Date: 02/25/2019 ----------------------------------------------------------------------  OBSTETRICS REPORT                       (Signed Final 02/25/2019 01:36 pm) ---------------------------------------------------------------------- Patient Info  ID #:       [id number]                          D.O.B.:  1991-08-11 (27 yrs)  Name:       Veronica Bass                 Visit Date: 02/25/2019 09:47 am ---------------------------------------------------------------------- Performed By  Performed By:     Wilnette Kales        Ref. Address:     [place], [place]  Attending:        Tama High MD        [facility]  Referred By:      [facility] ---------------------------------------------------------------------- Orders   #  Description                          Code         Ordered By   1  [name] MFM FETAL BPP  WO NON              76819.01     Lenell Mcconnell      STRESS   2  US MFM FETAL BPP WO NST              [id number]      Judeth HornERIN Karma Hiney      ADDL GESTATION  ----------------------------------------------------------------------   #  Order #                    Accession #                 Episode #   1  [id number]                  [id number]                  [id number]   2  [id number]                  [id number]                  [id number]  ---------------------------------------------------------------------- Indications   Decreased fetal movements, third trimester,    O36.8130   unspecified   Twin pregnancy, mono/di, second trimester      O30.032   [redacted] weeks gestation of pregnancy                Z3A.30  ---------------------------------------------------------------------- Fetal Evaluation (Fetus A)  Num Of Fetuses:         2  Fetal Heart Rate(bpm):  135  Cardiac Activity:       Observed  Fetal Lie:              Maternal right side  Presentation:           Breech, footling  Placenta:               Posterior  P. Cord Insertion:      Visualized, central  Amniotic Fluid  AFI FV:      Subjectively within normal limits                              Largest Pocket(cm)                              3.95 ---------------------------------------------------------------------- Biophysical Evaluation (Fetus A)  Amniotic  F.V:   Within normal limits       F. Tone:        Observed  F. Movement:    Observed                   Score:          8/8  F. Breathing:   Observed ---------------------------------------------------------------------- OB History  Gravidity:    3         Term:   2        Prem:   0        SAB:   0  TOP:          0       Ectopic:  0        Living: 20 ---------------------------------------------------------------------- Gestational Age (Fetus A)  LMP:           30w 6d        Date:  07/24/18  EDD:   04/30/19  Best:          30w 6d     Det. By:  LMP  (07/24/18)          EDD:   04/30/19 ---------------------------------------------------------------------- Anatomy (Fetus A)  Ventricles:            Appears normal         Abdominal Wall:         Appears nml (cord                                                                        insert, abd wall)  Heart:                 Appears normal         Kidneys:                Appear normal                         (4CH, axis, and                         situs)  Stomach:               Appears normal, left   Bladder:                Appears normal                         sided ---------------------------------------------------------------------- Fetal Evaluation (Fetus B)  Num Of Fetuses:         2  Fetal Heart Rate(bpm):  119  Cardiac Activity:       Observed  Fetal Lie:              Maternal left side  Presentation:           Cephalic  Placenta:               Posterior  P. Cord Insertion:      Not well visualized  Amniotic Fluid  AFI FV:      Subjectively within normal limits                              Largest Pocket(cm)                              3.61 ---------------------------------------------------------------------- Biophysical Evaluation (Fetus B)  Amniotic F.V:   Within normal limits       F. Tone:        Observed  F. Movement:    Observed                   Score:          8/8  F. Breathing:   Observed  ---------------------------------------------------------------------- Gestational Age (Fetus B)  LMP:           30w 6d        Date:  07/24/18  EDD:   04/30/19  Best:          30w 6d     Det. By:  LMP  (07/24/18)          EDD:   04/30/19 ---------------------------------------------------------------------- Anatomy (Fetus B)  Heart:                 Appears normal         Abdominal Wall:         Appears nml (cord                         (4CH, axis, and                                insert, abd wall)                         situs)  RVOT:                  Appears normal         Kidneys:                Appear normal  LVOT:                  Appears normal         Bladder:                Appears normal  Stomach:               Appears normal, left                         sided ---------------------------------------------------------------------- Impression  Monochorionic-diamniotic twin pregnancy.  Twin A: Maternal right, breech footling, posterior placenta.  Amniotic fluid is normal and good fetal activity is seen. Fetal  bladder appears normal. Antenatal testing is reassuring. BPP  8/8.  Twin B: Maternal left, cephalic, posterior placenta. Amniotic  fluid is normal and good fetal activity is seen. Fetal bladder  appears normal. Antenatal testing is reassuring. BPP 8/8.  No evidence of twin-to-twin transfusion syndrome. ----------------------------------------------------------------------                  Noralee Spaceavi Shankar, MD Electronically Signed Final Report   02/25/2019 01:36 pm ----------------------------------------------------------------------   MDM Reactive NST x 2 BPP 8/8 x2  Assessment and Plan  A: 1. Decreased fetal movement affecting management of pregnancy in third trimester, fetus 2   2. [redacted] weeks gestation of pregnancy   3. Monochorionic diamniotic twin gestation in third trimester   4. NST (non-stress test) reactive    P: Discharge home Discussed reasons to return to MAU F/u  with OB as scheduled   Judeth HornLawrence, Lanell Carpenter, NP 02/25/2019 8:36 AM

## 2019-02-25 NOTE — Discharge Instructions (Signed)
Warning Signs During Pregnancy °A pregnancy lasts about 40 weeks, starting from the first day of your last period until the baby is born. Pregnancy is divided into three phases called trimesters. °· The first trimester refers to week 1 through week 13 of pregnancy. °· The second trimester is the start of week 14 through the end of week 27. °· The third trimester is the start of week 28 until you deliver your baby. °During each trimester of pregnancy, certain signs and symptoms may indicate a problem. Talk with your health care provider about your current health and any medical conditions you have. Make sure you know the symptoms that you should watch for and report. °How does this affect me? ° °Warning signs in the first trimester °While some changes during the first trimester may be uncomfortable, most do not represent a serious problem. Let your health care provider know if you have any of the following warning signs in the first trimester: °· You cannot eat or drink without vomiting, and this lasts for longer than a day. °· You have vaginal bleeding or spotting along with menstrual-like cramping. °· You have diarrhea for longer than a day. °· You have a fever or other signs of infection, such as: °? Pain or burning when you urinate. °? Foul smelling or thick or yellowish vaginal discharge. °Warning signs in the second trimester °As your baby grows and changes during the second trimester, there are additional signs and symptoms that may indicate a problem. These include: °· Signs and symptoms of infection, including a fever. °· Signs or symptoms of a miscarriage or preterm labor, such as regular contractions, menstrual-like cramping, or lower abdominal pain. °· Bloody or watery vaginal discharge or obvious vaginal bleeding. °· Feeling like your heart is pounding. °· Having trouble breathing. °· Nausea, vomiting, or diarrhea that lasts for longer than a day. °· Craving non-food items, such as clay, chalk, or dirt.  This may be a sign of a very treatable medical condition called pica. °Later in your second trimester, watch for signs and symptoms of a serious medical condition called preeclampsia.These include: °· Changes in your vision. °· A severe headache that does not go away. °· Nausea and vomiting. °It is also important to notice if your baby stops moving or moves less than usual during this time. °Warning signs in the third trimester °As you approach the third trimester, your baby is growing and your body is preparing for the birth of your baby. In your third trimester, be sure to let your health care provider know if: °· You have signs and symptoms of infection, including a fever. °· You have vaginal bleeding. °· You notice that your baby is moving less than usual or is not moving. °· You have nausea, vomiting, or diarrhea that lasts for longer than a day. °· You have a severe headache that does not go away. °· You have vision changes, including seeing spots or having blurry or double vision. °· You have increased swelling in your hands or face. °How does this affect my baby? °Throughout your pregnancy, always report any of the warning signs of a problem to your health care provider. This can help prevent complications that may affect your baby, including: °· Increased risk for premature birth. °· Infection that may be transmitted to your baby. °· Increased risk for stillbirth. °Contact a health care provider if: °· You have any of the warning signs of a problem for the current trimester of your pregnancy. °·   Any of the following apply to you during any trimester of pregnancy: ? You have strong emotions, such as sadness or anxiety, that interfere with work or personal relationships. ? You feel unsafe in your home and need help finding a safe place to live. ? You are using tobacco products, alcohol, or drugs and you need help to stop. Get help right away if: You have signs or symptoms of labor before 37 weeks of  pregnancy. These include:  Contractions that are 5 minutes or less apart, or that increase in frequency, intensity, or length.  Sudden, sharp abdominal pain or low back pain.  Uncontrolled gush or trickle of fluid from your vagina. Summary  A pregnancy lasts about 40 weeks, starting from the first day of your last period until the baby is born. Pregnancy is divided into three phases called trimesters. Each trimester has warning signs to watch for.  Always report any warning signs to your health care provider in order to prevent complications that may affect both you and your baby.  Talk with your health care provider about your current health and any medical conditions you have. Make sure you know the symptoms that you should watch for and report. This information is not intended to replace advice given to you by your health care provider. Make sure you discuss any questions you have with your health care provider. Document Released: 05/19/2017 Document Revised: 11/21/2018 Document Reviewed: 05/19/2017 Elsevier Patient Education  2020 [employer] Fetal Movement Counts Patient Name: ________________________________________________ Patient Due Date: ____________________ What is a fetal movement count?  A fetal movement count is the number of times that you feel your baby move during a certain amount of time. This may also be called a fetal kick count. A fetal movement count is recommended for every pregnant woman. You may be asked to start counting fetal movements as early as week 28 of your pregnancy. Pay attention to when your baby is most active. You may notice your baby's sleep and wake cycles. You may also notice things that make your baby move more. You should do a fetal movement count:  When your baby is normally most active.  At the same time each day. A good time to count movements is while you are resting, after having something to eat and drink. How do I count fetal  movements? 1. Find a quiet, comfortable area. Sit, or lie down on your side. 2. Write down the date, the start time and stop time, and the number of movements that you felt between those two times. Take this information with you to your health care visits. 3. For 2 hours, count kicks, flutters, swishes, rolls, and jabs. You should feel at least 10 movements during 2 hours. 4. You may stop counting after you have felt 10 movements. 5. If you do not feel 10 movements in 2 hours, have something to eat and drink. Then, keep resting and counting for 1 hour. If you feel at least 4 movements during that hour, you may stop counting. Contact a health care provider if:  You feel fewer than 4 movements in 2 hours.  Your baby is not moving like he or she usually does. Date: ____________ Start time: ____________ Stop time: ____________ Movements: ____________ Date: ____________ Start time: ____________ Stop time: ____________ Movements: ____________ Date: ____________ Start time: ____________ Stop time: ____________ Movements: ____________ Date: ____________ Start time: ____________ Stop time: ____________ Movements: ____________ Date: ____________ Start time: ____________ Stop time: ____________ Movements: ____________ Date: ____________  Start time: ____________ Stop time: ____________ Movements: ____________ Date: ____________ Start time: ____________ Stop time: ____________ Movements: ____________ Date: ____________ Start time: ____________ Stop time: ____________ Movements: ____________ Date: ____________ Start time: ____________ Stop time: ____________ Movements: ____________ This information is not intended to replace advice given to you by your health care provider. Make sure you discuss any questions you have with your health care provider. Document Released: 09/01/2006 Document Revised: 08/22/2018 Document Reviewed: 09/11/2015 Elsevier Patient Education  2020 Elsevier Inc.  

## 2019-03-09 ENCOUNTER — Other Ambulatory Visit: Payer: Self-pay | Admitting: Obstetrics

## 2019-03-19 ENCOUNTER — Encounter (HOSPITAL_COMMUNITY): Payer: Self-pay

## 2019-03-19 ENCOUNTER — Telehealth (HOSPITAL_COMMUNITY): Payer: Self-pay | Admitting: *Deleted

## 2019-03-19 NOTE — Telephone Encounter (Signed)
Preadmission screen  

## 2019-03-19 NOTE — Patient Instructions (Signed)
Arasely Akkerman  03/19/2019   Your procedure is scheduled on:  03/29/2019  Arrive at 35 at Entrance C on [address] at [facility]  and [employer] [employer] [address]. You are invited to use the FREE valet parking or use the Visitor's parking deck.  Pick up the phone at the desk and dial [phone].  Call this number if you have problems the morning of surgery: [phone]  Remember:   Do not eat food:(After Midnight) Desps de medianoche.  Do not drink clear liquids: (After Midnight) Desps de medianoche.  Take these medicines the morning of surgery with A SIP OF WATER:  May take protonix   Do not wear jewelry, make-up or nail polish.  Do not wear lotions, powders, or perfumes. Do not wear deodorant.  Do not shave 48 hours prior to surgery.  Do not bring valuables to the hospital.  [facility] is not   responsible for any belongings or valuables brought to the hospital.  Contacts, dentures or bridgework may not be worn into surgery.  Leave suitcase in the car. After surgery it may be brought to your room.  For patients admitted to the hospital, checkout time is 11:00 AM the day of              discharge.      Please read over the following fact sheets that you were given:     Preparing for Surgery

## 2019-03-20 ENCOUNTER — Encounter (HOSPITAL_COMMUNITY): Payer: Self-pay

## 2019-03-27 ENCOUNTER — Other Ambulatory Visit: Payer: Self-pay

## 2019-03-27 ENCOUNTER — Other Ambulatory Visit (HOSPITAL_COMMUNITY)
Admission: RE | Admit: 2019-03-27 | Discharge: 2019-03-27 | Disposition: A | Discharge status: Home / Self Care | Payer: Self-pay | Source: Ambulatory Visit | Attending: Obstetrics | Admitting: Obstetrics

## 2019-03-27 DIAGNOSIS — Z20828 Contact with and (suspected) exposure to other viral communicable diseases: Secondary | ICD-10-CM | POA: Insufficient documentation

## 2019-03-27 DIAGNOSIS — Z01812 Encounter for preprocedural laboratory examination: Secondary | ICD-10-CM | POA: Insufficient documentation

## 2019-03-27 LAB — TYPE AND SCREEN
ABO/RH(D): O POS
Antibody Screen: NEGATIVE

## 2019-03-27 LAB — SARS CORONAVIRUS 2 (TAT 6-24 HRS): SARS Coronavirus 2: NEGATIVE

## 2019-03-27 LAB — ABO/RH: ABO/RH(D): O POS

## 2019-03-28 NOTE — Anesthesia Preprocedure Evaluation (Addendum)
Anesthesia Evaluation  Patient identified by MRN, date of birth, ID band Patient awake    Reviewed: Allergy & Precautions, NPO status , Patient's Chart, lab work & pertinent test results  History of Anesthesia Complications Negative for: history of anesthetic complications  Airway Mallampati: II  TM Distance: >3 FB Neck ROM: Full    Dental no notable dental hx.    Pulmonary neg pulmonary ROS,    Pulmonary exam normal        Cardiovascular negative cardio ROS Normal cardiovascular exam     Neuro/Psych Anxiety negative neurological ROS     GI/Hepatic Neg liver ROS, GERD  Medicated and Controlled,  Endo/Other  negative endocrine ROS  Renal/GU negative Renal ROS     Musculoskeletal negative musculoskeletal ROS (+)   Abdominal   Peds  Hematology negative hematology ROS (+) anemia ,   Anesthesia Other Findings   Reproductive/Obstetrics (+) Pregnancy Twin gestation, hx of C/S x2                           Anesthesia Physical  Anesthesia Plan  ASA: III  Anesthesia Plan: Spinal   Post-op Pain Management:    Induction:   PONV Risk Score and Plan: 3 and Dexamethasone, Ondansetron and Treatment may vary due to age or medical condition  Airway Management Planned: Natural Airway  Additional Equipment:   Intra-op Plan:   Post-operative Plan:   Informed Consent: I have reviewed the patients History and Physical, chart, labs and discussed the procedure including the risks, benefits and alternatives for the proposed anesthesia with the patient or authorized representative who has indicated his/her understanding and acceptance.       Plan Discussed with: CRNA  Anesthesia Plan Comments:       Anesthesia Quick Evaluation

## 2019-03-28 NOTE — H&P (Signed)
Veronica Bass is a 28 y.o. G3P2002 at 8198w3d presenting for repeat cesarean section due to prior cesarean section x2, prior low vertical incision, increasing discordancy between fetal growths in a mono di-twin pair, marginal versus velamentous cord insertion and twin B and patient's growing concerned about intrauterine fetal demise in mono di-twins.. Pt notes no contractions . Good fetal movement x2, No vaginal bleeding, not leaking fluid.  [facility] -Prior cesarean section x2, for cesarean section after arrest of descent with a T'd incision which will be treated as a low vertical incision.  Patient had a repeat C-section as a second pregnancy and planning C-section now.- -Unplanned pregnancy -Monochorionic diamniotic twins.  Patient's been followed closely with no evidence of twin-twin transfusion syndrome.  However over the last few ultrasounds increasing growth discordance with be being a smaller baby.  Given high risks of complications of mono di-twin pairs patient has had an MFM consult, has had normal fetal echo x2 and has had close monitoring throughout the pregnancy.  Given ACOG and S MFM allow large range of delivery from 34 to [redacted] weeks gestation patient has opted for delivery on the earlier side.  Patient is aware likelihood of NICU admission for fetal size and lung maturity.  In addition long discussion with patient was had regarding betamethasone to advance fetal lung maturity.  However given new concerns for long-term neurodevelopmental outcomes and limited benefits in gestations greater than 34 weeks patient is opted against betamethasone. -Reflux disease, on Protonix -Anemia.  On iron -Depression and anxiety, on Zoloft -[name] with marginal versus velamentous cord insertion -Fetal growth.  On August 5, at 34 weeks, [name] 4 pounds 14 ounces, vertex presentation, [name] 4 pounds 3 ounces, breech presentation.  13% discordance   Prenatal Transfer Tool  Maternal  Diabetes: No Genetic Screening: Normal Maternal Ultrasounds/Referrals: Normal Fetal Ultrasounds or other Referrals:  Fetal echo, Referred to Materal Fetal Medicine  Maternal Substance Abuse:  No Significant Maternal Medications:  None Significant Maternal Lab Results: Group B Strep negative     OB History    Gravida  3   Para  2   Term  2   Preterm      AB      Living  2     SAB      TAB      Ectopic      Multiple  0   Live Births  2          Past Medical History:  Diagnosis Date  . Anxiety   . Headache   . Heart murmur    hx of heart murmur  . History of UTI   . Seasonal allergies    Past Surgical History:  Procedure Laterality Date  . CESAREAN SECTION N/A 03/26/2015   Procedure: CESAREAN SECTION;  Surgeon: Jaymes GraffNaima Dillard, MD;  Location: [facility];  Service: Obstetrics;  Laterality: N/A;  . CESAREAN SECTION N/A 07/04/2017   Procedure: Repeat CESAREAN SECTION;  Surgeon: Noland FordyceFogleman, Militza Devery, MD;  [facility];  Service: Obstetrics;  Laterality: N/A;  EDD: 07/10/17   Family History: family history includes Heart disease in her paternal grandfather; Stroke in her paternal grandmother. Social History:  reports that she has never smoked. She has never used smokeless tobacco. She reports that she does not drink alcohol or use drugs.  Review of Systems - Negative except Discomfort of pregnancy    Physical Exam:  Vitals:   03/29/19 1104  Temp: 98 F (36.7 C)    Gen: well appearing, no distress Back: no CVAT Abd: gravid, NT, no RUQ pain LE: trace edema, equal bilaterally, non-tender   Prenatal labs: ABO, Rh: --/--/O POS, O POS (08/11 0920) Antibody: NEG (08/11 0920) Rubella: Immune (02/21 0000) RPR: Nonreactive (02/21 0000)  HBsAg: Negative (02/21 0000)  HIV: Non-reactive (02/21 0000)  GBS:   Negative 1 hr Glucola 107  Genetic screening normal panorama, normal AFP Anatomy US normal, mono chorionic diamniotic  twins   Assessment/Plan: 28 y.o. [id number] at [redacted]w[redacted]d Repeat cesarean section, no tubal ligation as patient wants to retain ability for future pregnancies.  Patient is aware of recommended timeframes for delivery of mono di-twins but also aware of the high risk of complications including intrauterine fetal demise.  Given the increasing discordance in growth and the marginal versus velamentous cord insertion of [name] patient is requesting earlier cesarean section.  This is in alignment with guidelines from ACOG and SMFM.  Patient understands risks of NICU admission for size and fetal lung maturity and accepts these risks.  NICU is aware of the planned delivery.  Betamethasone has not been given.  See discussion in the above note. -Continue Zoloft postpartum   Ala Dach 03/28/2019, 10:44 PM   Ala Dach 03/29/2019 12:22 PM

## 2019-03-29 ENCOUNTER — Encounter (HOSPITAL_COMMUNITY)
Admission: RE | Disposition: A | Discharge status: Home / Self Care | Payer: Self-pay | Source: Home / Self Care | Attending: Obstetrics

## 2019-03-29 ENCOUNTER — Inpatient Hospital Stay (HOSPITAL_COMMUNITY)
Admission: RE | Admit: 2019-03-29 | Discharge: 2019-04-01 | DRG: 787 | Disposition: A | Discharge status: Home / Self Care | Payer: Self-pay | Attending: Obstetrics | Admitting: Obstetrics

## 2019-03-29 ENCOUNTER — Inpatient Hospital Stay (HOSPITAL_COMMUNITY): Payer: Self-pay | Admitting: Anesthesiology

## 2019-03-29 ENCOUNTER — Encounter (HOSPITAL_COMMUNITY): Payer: Self-pay | Admitting: *Deleted

## 2019-03-29 DIAGNOSIS — Z3A35 35 weeks gestation of pregnancy: Secondary | ICD-10-CM

## 2019-03-29 DIAGNOSIS — O321XX2 Maternal care for breech presentation, fetus 2: Secondary | ICD-10-CM | POA: Diagnosis present

## 2019-03-29 DIAGNOSIS — O1205 Gestational edema, complicating the puerperium: Secondary | ICD-10-CM | POA: Diagnosis present

## 2019-03-29 DIAGNOSIS — O34211 Maternal care for low transverse scar from previous cesarean delivery: Principal | ICD-10-CM | POA: Diagnosis present

## 2019-03-29 DIAGNOSIS — D62 Acute posthemorrhagic anemia: Secondary | ICD-10-CM | POA: Diagnosis not present

## 2019-03-29 DIAGNOSIS — O9081 Anemia of the puerperium: Secondary | ICD-10-CM | POA: Diagnosis not present

## 2019-03-29 DIAGNOSIS — O99344 Other mental disorders complicating childbirth: Secondary | ICD-10-CM | POA: Diagnosis present

## 2019-03-29 DIAGNOSIS — F329 Major depressive disorder, single episode, unspecified: Secondary | ICD-10-CM | POA: Diagnosis present

## 2019-03-29 DIAGNOSIS — F419 Anxiety disorder, unspecified: Secondary | ICD-10-CM | POA: Diagnosis present

## 2019-03-29 DIAGNOSIS — O43123 Velamentous insertion of umbilical cord, third trimester: Secondary | ICD-10-CM | POA: Diagnosis present

## 2019-03-29 DIAGNOSIS — O30033 Twin pregnancy, monochorionic/diamniotic, third trimester: Secondary | ICD-10-CM | POA: Diagnosis present

## 2019-03-29 DIAGNOSIS — D696 Thrombocytopenia, unspecified: Secondary | ICD-10-CM | POA: Diagnosis present

## 2019-03-29 DIAGNOSIS — Z98891 History of uterine scar from previous surgery: Secondary | ICD-10-CM

## 2019-03-29 DIAGNOSIS — O9912 Other diseases of the blood and blood-forming organs and certain disorders involving the immune mechanism complicating childbirth: Secondary | ICD-10-CM | POA: Diagnosis present

## 2019-03-29 LAB — CBC
HCT: 38.1 % (ref 36.0–46.0)
Hemoglobin: 12.7 g/dL (ref 12.0–15.0)
MCH: 31.8 pg (ref 26.0–34.0)
MCHC: 33.3 g/dL (ref 30.0–36.0)
MCV: 95.3 fL (ref 80.0–100.0)
Platelets: 140 10*3/uL — ABNORMAL LOW (ref 150–400)
RBC: 4 MIL/uL (ref 3.87–5.11)
RDW: 12.6 % (ref 11.5–15.5)
WBC: 9.3 10*3/uL (ref 4.0–10.5)
nRBC: 0 % (ref 0.0–0.2)

## 2019-03-29 SURGERY — Surgical Case
Anesthesia: Spinal

## 2019-03-29 MED ORDER — PHENYLEPHRINE HCL-NACL 20-0.9 MG/250ML-% IV SOLN
INTRAVENOUS | Status: DC | PRN
  Administered 2019-03-29: 60 ug/min via INTRAVENOUS

## 2019-03-29 MED ORDER — BUPIVACAINE IN DEXTROSE 0.75-8.25 % IT SOLN
INTRATHECAL | Status: DC | PRN
  Administered 2019-03-29: 1.6 mL via INTRATHECAL

## 2019-03-29 MED ORDER — MORPHINE SULFATE (PF) 0.5 MG/ML IJ SOLN
INTRAMUSCULAR | Status: AC
  Filled 2019-03-29: qty 10

## 2019-03-29 MED ORDER — KETOROLAC TROMETHAMINE 30 MG/ML IJ SOLN: 30.0000 mg | Freq: Four times a day (QID) | INTRAMUSCULAR | Status: AC | PRN

## 2019-03-29 MED ORDER — TETANUS-DIPHTH-ACELL PERTUSSIS 5-2.5-18.5 LF-MCG/0.5 IM SUSP: 0.5000 mL | Freq: Once | INTRAMUSCULAR | Status: DC

## 2019-03-29 MED ORDER — ONDANSETRON HCL 4 MG/2ML IJ SOLN: 4.0000 mg | Freq: Three times a day (TID) | INTRAMUSCULAR | Status: DC | PRN

## 2019-03-29 MED ORDER — SODIUM CHLORIDE 0.9 % IV SOLN
INTRAVENOUS | Status: DC | PRN
  Administered 2019-03-29: 13:00:00 via INTRAVENOUS

## 2019-03-29 MED ORDER — DIBUCAINE (PERIANAL) 1 % EX OINT: 1.0000 "application " | TOPICAL_OINTMENT | CUTANEOUS | Status: DC | PRN

## 2019-03-29 MED ORDER — ZOLPIDEM TARTRATE 5 MG PO TABS: 5.0000 mg | ORAL_TABLET | Freq: Every evening | ORAL | Status: DC | PRN

## 2019-03-29 MED ORDER — SENNOSIDES-DOCUSATE SODIUM 8.6-50 MG PO TABS
2.0000 | ORAL_TABLET | ORAL | Status: DC
  Administered 2019-03-29 – 2019-03-31 (×3): 2 via ORAL
  Filled 2019-03-29 (×3): qty 2

## 2019-03-29 MED ORDER — ONDANSETRON HCL 4 MG/2ML IJ SOLN
INTRAMUSCULAR | Status: AC
  Filled 2019-03-29: qty 2

## 2019-03-29 MED ORDER — KETOROLAC TROMETHAMINE 30 MG/ML IJ SOLN
INTRAMUSCULAR | Status: AC
  Filled 2019-03-29: qty 1

## 2019-03-29 MED ORDER — NALBUPHINE HCL 10 MG/ML IJ SOLN: 5.0000 mg | INTRAMUSCULAR | Status: DC | PRN

## 2019-03-29 MED ORDER — MORPHINE SULFATE (PF) 0.5 MG/ML IJ SOLN
INTRAMUSCULAR | Status: DC | PRN
  Administered 2019-03-29: .15 mg via INTRATHECAL

## 2019-03-29 MED ORDER — FENTANYL CITRATE (PF) 100 MCG/2ML IJ SOLN
INTRAMUSCULAR | Status: AC
  Filled 2019-03-29: qty 2

## 2019-03-29 MED ORDER — DIPHENHYDRAMINE HCL 25 MG PO CAPS: 25.0000 mg | ORAL_CAPSULE | ORAL | Status: DC | PRN

## 2019-03-29 MED ORDER — FENTANYL CITRATE (PF) 100 MCG/2ML IJ SOLN
INTRAMUSCULAR | Status: DC | PRN
  Administered 2019-03-29: 15 ug via INTRATHECAL

## 2019-03-29 MED ORDER — CEFAZOLIN SODIUM-DEXTROSE 2-4 GM/100ML-% IV SOLN
INTRAVENOUS | Status: AC
  Filled 2019-03-29: qty 100

## 2019-03-29 MED ORDER — KETOROLAC TROMETHAMINE 30 MG/ML IJ SOLN
30.0000 mg | Freq: Four times a day (QID) | INTRAMUSCULAR | Status: AC | PRN
  Administered 2019-03-29: 30 mg via INTRAVENOUS

## 2019-03-29 MED ORDER — COCONUT OIL OIL: 1.0000 "application " | TOPICAL_OIL | Status: DC | PRN

## 2019-03-29 MED ORDER — ONDANSETRON HCL 4 MG/2ML IJ SOLN
INTRAMUSCULAR | Status: DC | PRN
  Administered 2019-03-29: 4 mg via INTRAVENOUS

## 2019-03-29 MED ORDER — PHENYLEPHRINE HCL-NACL 20-0.9 MG/250ML-% IV SOLN
INTRAVENOUS | Status: AC
  Filled 2019-03-29: qty 250

## 2019-03-29 MED ORDER — OXYTOCIN 40 UNITS IN NORMAL SALINE INFUSION - SIMPLE MED
INTRAVENOUS | Status: AC
  Filled 2019-03-29: qty 1000

## 2019-03-29 MED ORDER — NALBUPHINE HCL 10 MG/ML IJ SOLN: 5.0000 mg | Freq: Once | INTRAMUSCULAR | Status: DC | PRN

## 2019-03-29 MED ORDER — SIMETHICONE 80 MG PO CHEW
80.0000 mg | CHEWABLE_TABLET | ORAL | Status: DC
  Administered 2019-03-29 – 2019-03-31 (×3): 80 mg via ORAL
  Filled 2019-03-29 (×3): qty 1

## 2019-03-29 MED ORDER — ACETAMINOPHEN 500 MG PO TABS
1000.0000 mg | ORAL_TABLET | Freq: Once | ORAL | Status: AC
  Administered 2019-03-29: 1000 mg via ORAL

## 2019-03-29 MED ORDER — IBUPROFEN 800 MG PO TABS
800.0000 mg | ORAL_TABLET | Freq: Three times a day (TID) | ORAL | Status: AC
  Administered 2019-03-29 – 2019-04-01 (×8): 800 mg via ORAL
  Filled 2019-03-29 (×8): qty 1

## 2019-03-29 MED ORDER — CEFAZOLIN SODIUM-DEXTROSE 2-4 GM/100ML-% IV SOLN
2.0000 g | INTRAVENOUS | Status: AC
  Administered 2019-03-29: 2 g via INTRAVENOUS

## 2019-03-29 MED ORDER — ACETAMINOPHEN 500 MG PO TABS
1000.0000 mg | ORAL_TABLET | Freq: Four times a day (QID) | ORAL | Status: AC
  Administered 2019-03-29 – 2019-03-30 (×3): 1000 mg via ORAL
  Filled 2019-03-29 (×3): qty 2

## 2019-03-29 MED ORDER — WITCH HAZEL-GLYCERIN EX PADS: 1.0000 "application " | MEDICATED_PAD | CUTANEOUS | Status: DC | PRN

## 2019-03-29 MED ORDER — LACTATED RINGERS IV SOLN
INTRAVENOUS | Status: DC
  Administered 2019-03-30: via INTRAVENOUS

## 2019-03-29 MED ORDER — NALOXONE HCL 0.4 MG/ML IJ SOLN: 0.4000 mg | INTRAMUSCULAR | Status: DC | PRN

## 2019-03-29 MED ORDER — OXYTOCIN 40 UNITS IN NORMAL SALINE INFUSION - SIMPLE MED: 2.5000 [IU]/h | INTRAVENOUS | Status: AC

## 2019-03-29 MED ORDER — DIPHENHYDRAMINE HCL 25 MG PO CAPS: 25.0000 mg | ORAL_CAPSULE | Freq: Four times a day (QID) | ORAL | Status: DC | PRN

## 2019-03-29 MED ORDER — NALOXONE HCL 4 MG/10ML IJ SOLN
1.0000 ug/kg/h | INTRAVENOUS | Status: DC | PRN
  Filled 2019-03-29: qty 5

## 2019-03-29 MED ORDER — MENTHOL 3 MG MT LOZG: 1.0000 | LOZENGE | OROMUCOSAL | Status: DC | PRN

## 2019-03-29 MED ORDER — SODIUM CHLORIDE 0.9% FLUSH: 3.0000 mL | INTRAVENOUS | Status: DC | PRN

## 2019-03-29 MED ORDER — OXYCODONE-ACETAMINOPHEN 5-325 MG PO TABS
1.0000 | ORAL_TABLET | ORAL | Status: DC | PRN
  Administered 2019-03-30: 1 via ORAL
  Administered 2019-03-31: 2 via ORAL
  Filled 2019-03-29: qty 2
  Filled 2019-03-29: qty 1

## 2019-03-29 MED ORDER — FENTANYL CITRATE (PF) 100 MCG/2ML IJ SOLN: 25.0000 ug | INTRAMUSCULAR | Status: DC | PRN

## 2019-03-29 MED ORDER — DIPHENHYDRAMINE HCL 50 MG/ML IJ SOLN: 12.5000 mg | INTRAMUSCULAR | Status: DC | PRN

## 2019-03-29 MED ORDER — PROMETHAZINE HCL 25 MG/ML IJ SOLN: 6.2500 mg | INTRAMUSCULAR | Status: DC | PRN

## 2019-03-29 MED ORDER — SODIUM CHLORIDE 0.9 % IR SOLN
Status: DC | PRN
  Administered 2019-03-29: 1

## 2019-03-29 MED ORDER — LACTATED RINGERS IV SOLN
INTRAVENOUS | Status: DC
  Administered 2019-03-29 (×3): via INTRAVENOUS

## 2019-03-29 MED ORDER — PRENATAL MULTIVITAMIN CH
1.0000 | ORAL_TABLET | Freq: Every day | ORAL | Status: DC
  Administered 2019-03-30 – 2019-04-01 (×3): 1 via ORAL
  Filled 2019-03-29 (×3): qty 1

## 2019-03-29 MED ORDER — SIMETHICONE 80 MG PO CHEW: 80.0000 mg | CHEWABLE_TABLET | ORAL | Status: DC | PRN

## 2019-03-29 MED ORDER — DEXAMETHASONE SODIUM PHOSPHATE 10 MG/ML IJ SOLN
INTRAMUSCULAR | Status: DC | PRN
  Administered 2019-03-29: 10 mg via INTRAVENOUS

## 2019-03-29 MED ORDER — OXYTOCIN 10 UNIT/ML IJ SOLN
INTRAMUSCULAR | Status: DC | PRN
  Administered 2019-03-29: 40 [IU] via INTRAMUSCULAR

## 2019-03-29 MED ORDER — SIMETHICONE 80 MG PO CHEW
80.0000 mg | CHEWABLE_TABLET | Freq: Three times a day (TID) | ORAL | Status: DC
  Administered 2019-03-30 – 2019-04-01 (×7): 80 mg via ORAL
  Filled 2019-03-29 (×7): qty 1

## 2019-03-29 SURGICAL SUPPLY — 40 items
BENZOIN TINCTURE PRP APPL 2/3 (GAUZE/BANDAGES/DRESSINGS) ×3 IMPLANT
CLAMP CORD UMBIL (MISCELLANEOUS) IMPLANT
CLOSURE WOUND 1/2 X4 (GAUZE/BANDAGES/DRESSINGS) ×1
CLOTH BEACON ORANGE TIMEOUT ST (SAFETY) ×3 IMPLANT
DRSG OPSITE POSTOP 4X10 (GAUZE/BANDAGES/DRESSINGS) ×3 IMPLANT
ELECT REM PT RETURN 9FT ADLT (ELECTROSURGICAL) ×3
ELECTRODE REM PT RTRN 9FT ADLT (ELECTROSURGICAL) ×1 IMPLANT
EXTRACTOR VACUUM KIWI (MISCELLANEOUS) IMPLANT
EXTRACTOR VACUUM M CUP 4 TUBE (SUCTIONS) IMPLANT
EXTRACTOR VACUUM M CUP 4' TUBE (SUCTIONS)
GLOVE BIO SURGEON STRL SZ 6.5 (GLOVE) ×2 IMPLANT
GLOVE BIO SURGEONS STRL SZ 6.5 (GLOVE) ×1
GLOVE BIOGEL PI IND STRL 7.0 (GLOVE) ×2 IMPLANT
GLOVE BIOGEL PI INDICATOR 7.0 (GLOVE) ×4
GOWN STRL REUS W/TWL LRG LVL3 (GOWN DISPOSABLE) ×6 IMPLANT
KIT ABG SYR 3ML LUER SLIP (SYRINGE) IMPLANT
NDL SAFETY ECLIPSE 18X1.5 (NEEDLE) ×1 IMPLANT
NEEDLE HYPO 18GX1.5 SHARP (NEEDLE) ×2
NEEDLE HYPO 25X5/8 SAFETYGLIDE (NEEDLE) IMPLANT
NS IRRIG 1000ML POUR BTL (IV SOLUTION) ×3 IMPLANT
PACK C SECTION WH (CUSTOM PROCEDURE TRAY) ×3 IMPLANT
PAD ABD 7.5X8 STRL (GAUZE/BANDAGES/DRESSINGS) ×6 IMPLANT
PAD OB MATERNITY 4.3X12.25 (PERSONAL CARE ITEMS) ×3 IMPLANT
PENCIL SMOKE EVAC W/HOLSTER (ELECTROSURGICAL) ×3 IMPLANT
STRIP CLOSURE SKIN 1/2X4 (GAUZE/BANDAGES/DRESSINGS) ×2 IMPLANT
SUT MON AB 4-0 PS1 27 (SUTURE) ×3 IMPLANT
SUT PLAIN 0 NONE (SUTURE) IMPLANT
SUT PLAIN 2 0 XLH (SUTURE) IMPLANT
SUT VIC AB 0 CT1 36 (SUTURE) ×9 IMPLANT
SUT VIC AB 0 CTX 36 (SUTURE) ×4
SUT VIC AB 0 CTX36XBRD ANBCTRL (SUTURE) ×2 IMPLANT
SUT VIC AB 2-0 CT1 27 (SUTURE) ×2
SUT VIC AB 2-0 CT1 TAPERPNT 27 (SUTURE) ×1 IMPLANT
SUT VIC AB 4-0 KS 27 (SUTURE) ×3 IMPLANT
SYR BULB 3OZ (MISCELLANEOUS) ×3 IMPLANT
SYRINGE 10CC LL (SYRINGE) ×3 IMPLANT
TAPE CLOTH SURG 4X10 WHT LF (GAUZE/BANDAGES/DRESSINGS) ×3 IMPLANT
TOWEL OR 17X24 6PK STRL BLUE (TOWEL DISPOSABLE) ×3 IMPLANT
TRAY FOLEY W/BAG SLVR 14FR LF (SET/KITS/TRAYS/PACK) IMPLANT
WATER STERILE IRR 1000ML POUR (IV SOLUTION) ×3 IMPLANT

## 2019-03-29 NOTE — Anesthesia Procedure Notes (Signed)
Spinal  Patient location during procedure: OR Start time: 03/29/2019 12:35 PM End time: 03/29/2019 12:37 PM Staffing Anesthesiologist: Brennan Bailey, MD Performed: anesthesiologist  Preanesthetic Checklist Completed: patient identified, pre-op evaluation, timeout performed, IV checked, risks and benefits discussed and monitors and equipment checked Spinal Block Patient position: sitting Prep: site prepped and draped and DuraPrep Patient monitoring: heart rate, continuous pulse ox and blood pressure Approach: midline Location: L3-4 Injection technique: single-shot Needle Needle type: Pencan  Needle gauge: 24 G Needle length: 10 cm Assessment Sensory level: T4 Additional Notes Risks, benefits, and alternative discussed. Patient gave consent to procedure. Prepped and draped in sitting position. Clear CSF obtained after one needle pass. Positive terminal aspiration. No pain or paraesthesias with injection. Patient tolerated procedure well. Vital signs stable. Tawny Asal, MD

## 2019-03-29 NOTE — Anesthesia Postprocedure Evaluation (Signed)
Anesthesia Post Note  Patient: Fenna Semel  Procedure(s) Performed: Repeat CESAREAN SECTION MULTI-GESTATIONAL (N/A )     Patient location during evaluation: PACU Anesthesia Type: Spinal Level of consciousness: awake and alert and oriented Pain management: pain level controlled Vital Signs Assessment: post-procedure vital signs reviewed and stable Respiratory status: spontaneous breathing, nonlabored ventilation and respiratory function stable Cardiovascular status: blood pressure returned to baseline Postop Assessment: no apparent nausea or vomiting and spinal receding Anesthetic complications: no    Last Vitals:  Vitals:   03/29/19 1400 03/29/19 1410  BP: 109/68   Pulse: 66 64  Resp: 16 (!) 23  Temp: 37.6 C   SpO2: 100% 100%    Last Pain:  Vitals:   03/29/19 1400  TempSrc: Oral  PainSc: 0-No pain   Pain Goal:                Epidural/Spinal Function Cutaneous sensation: Able to Wiggle Toes (03/29/19 1400), Patient able to flex knees: No (03/29/19 1400), Patient able to lift hips off bed: No (03/29/19 1400), Back pain beyond tenderness at insertion site: No (03/29/19 1400), Progressively worsening motor and/or sensory loss: No (03/29/19 1400), Bowel and/or bladder incontinence post epidural: No (03/29/19 1400)  Brennan Bailey

## 2019-03-29 NOTE — Op Note (Signed)
03/29/2019  1:40 PM  PATIENT:  Veronica Bass  28 y.o. female  PRE-OPERATIVE DIAGNOSIS:  Previous Cesarean Section, Mono-Di Twins, History of Vertical Incision  POST-OPERATIVE DIAGNOSIS:  Previous Cesarean Section, Mono-Di Twins, History of Vertical Incision  PROCEDURE:  Procedure(s) with comments: Repeat CESAREAN SECTION MULTI-GESTATIONAL (N/A) - EDD: 04/30/19  LTCS with 2 layer closure  SURGEON:  Surgeon(s) and Role:    Veronica Bass Gell, MD - Primary  PHYSICIAN ASSISTANT:   ASSISTANTS: Veronica Bass, Veronica Bass   ANESTHESIA:   spinal  EBL:  Per nursing notes   BLOOD ADMINISTERED:none  DRAINS: Urinary Catheter (Foley)   LOCAL MEDICATIONS USED:  NONE  SPECIMEN:  Source of Specimen:  placenta  DISPOSITION OF SPECIMEN:  L&D for disposal  COUNTS:  YES  TOURNIQUET:  * No tourniquets in log *  DICTATION: .Note written in EPIC  PLAN OF CARE: Admit to inpatient   PATIENT DISPOSITION:  PACU - hemodynamically stable.   Delay start of Pharmacological VTE agent (>24hrs) due to surgical blood loss or risk of bleeding: yes     Findings:  @BABYSEXEBC @ infant,  APGAR (1 MIN):    Veronica Bass, Veronica Bass [[id number]]     Veronica Bass, Veronica Bass [[id number]]     APGAR (5 MINS):    Veronica Bass, Veronica Bass [[id number]]     Veronica Bass, Veronica Bass [[id number]]     APGAR (10 MINS):    Veronica Bass, Veronica Bass [[id number]]     Veronica Bass [[id number]]     Normal uterus, tubes and ovaries, normal placenta. 3VC, clear amniotic fluid A: vtx, clear fluid B: breech, clear fluid Fused mono-di placenta, B with velamentous CI  EBL: per nursing notes cc Antibiotics:   2g Ancef Complications: none  Indications: This is a 29 y.o. year-old, G3P2  At [redacted]w[redacted]d admitted for RCS. Risks benefits and alternatives of the procedure were discussed with the patient who agreed to proceed  Procedure:  After informed consent was obtained the patient was taken to the operating room where spinal  anesthesia was initiated.  She was prepped and draped in the normal sterile fashion in dorsal supine position with a leftward tilt.  A foley catheter was in place.  A Pfannenstiel skin incision was made 2 cm above the pubic symphysis in the midline with the scalpel, over the old incision. Dissection was carried down with the Bovie cautery until the fascia was reached. The fascia was incised in the midline. The incision was extended laterally with the Mayo scissors. The inferior aspect of the fascial incision was grasped with the Coker clamps, elevated up and the underlying rectus muscles were dissected off sharply. The superior aspect of the fascial incision was grasped with the Coker clamps elevated up and the underlying rectus muscles were dissected off sharply.  The peritoneum was entered sharply.. The peritoneal incision was extended superiorly and inferiorly with good visualization of the bladder. A bladder flap was created sharply.The bladder blade was inserted and palpation was done to assess the fetal position and the location of the uterine vessels. The lower segment of the uterus was incised sharply with the scalpel and extended  bluntly in the cephalo-caudal fashion. Amniotic sac of The A infant was ruptured, the vertex was  grasped, brought to the incision,  rotated and the infant was delivered with fundal pressure. The nose and mouth were bulb suctioned. The cord was clamped and cut after 1 minute delay. The infant was handed off to the waiting pediatrician. [name] was in a frank breech  position. Amniotic sac was ruptures. B was delivered easily from a breech presentation. Cord was clamped and cut aftert 1 minure delay. The placenta was expressed. The uterus was left in situ. The uterus was cleared of all clots and debris. The uterine incision was repaired with 0 Vicryl in a running locked fashion.  A second layer of the same suture was used in an imbricating fashion to obtain excellent hemostasis.  The  uterus was then returned to the abdomen, the gutters were cleared of all clots and debris. The uterine incision was reinspected and found to be hemostatic. The peritoneum was grasped and closed with 2-0 Vicryl in a running fashion. The cut muscle edges and the underside of the fascia were inspected and found to be hemostatic. The fascia was closed with 0 Vicryl in a single layer. The subcutaneous tissue was irrigated. Scarpa's layer was closed with a 2-0 plain gut suture. The skin was closed with a 4-0 Monocryl in a single layer. The patient tolerated the procedure well. Sponge lap and needle counts were correct x3 and patient was taken to the recovery room in a stable condition.  Veronica Bass 03/29/2019 1:42 PM

## 2019-03-29 NOTE — Brief Op Note (Signed)
03/29/2019  1:40 PM  PATIENT:  Veronica Bass  28 y.o. female  PRE-OPERATIVE DIAGNOSIS:  Previous Cesarean Section, Mono-Di Twins, History of Vertical Incision  POST-OPERATIVE DIAGNOSIS:  Previous Cesarean Section, Mono-Di Twins, History of Vertical Incision  PROCEDURE:  Procedure(s) with comments: Repeat CESAREAN SECTION MULTI-GESTATIONAL (N/A) - EDD: 04/30/19  LTCS with 2 layer closure  SURGEON:  Surgeon(s) and Role:    Aloha Gell, MD - Primary  PHYSICIAN ASSISTANT:   ASSISTANTS: Haskel Khan, CNM   ANESTHESIA:   spinal  EBL:  Per nursing notes   BLOOD ADMINISTERED:none  DRAINS: Urinary Catheter (Foley)   LOCAL MEDICATIONS USED:  NONE  SPECIMEN:  Source of Specimen:  placenta  DISPOSITION OF SPECIMEN:  L&D for disposal  COUNTS:  YES  TOURNIQUET:  * No tourniquets in log *  DICTATION: .Note written in EPIC  PLAN OF CARE: Admit to inpatient   PATIENT DISPOSITION:  PACU - hemodynamically stable.   Delay start of Pharmacological VTE agent (>24hrs) due to surgical blood loss or risk of bleeding: yes

## 2019-03-29 NOTE — Transfer of Care (Signed)
Immediate Anesthesia Transfer of Care Note  Patient: Veronica Bass  Procedure(s) Performed: Repeat CESAREAN SECTION MULTI-GESTATIONAL (N/A )  Patient Location: PACU  Anesthesia Type:Spinal  Level of Consciousness: awake  Airway & Oxygen Therapy: Patient Spontanous Breathing  Post-op Assessment: Report given to RN  Post vital signs: Reviewed and stable  Last Vitals:  Vitals Value Taken Time  BP    Temp    Pulse    Resp    SpO2      Last Pain:  Vitals:   03/29/19 1104  TempSrc: Oral         Complications: No apparent anesthesia complications

## 2019-03-30 LAB — CBC
HCT: 33.6 % — ABNORMAL LOW (ref 36.0–46.0)
Hemoglobin: 11.2 g/dL — ABNORMAL LOW (ref 12.0–15.0)
MCH: 32.4 pg (ref 26.0–34.0)
MCHC: 33.3 g/dL (ref 30.0–36.0)
MCV: 97.1 fL (ref 80.0–100.0)
Platelets: 141 10*3/uL — ABNORMAL LOW (ref 150–400)
RBC: 3.46 MIL/uL — ABNORMAL LOW (ref 3.87–5.11)
RDW: 12.8 % (ref 11.5–15.5)
WBC: 9.3 10*3/uL (ref 4.0–10.5)
nRBC: 0 % (ref 0.0–0.2)

## 2019-03-30 LAB — RPR: RPR Ser Ql: NONREACTIVE

## 2019-03-30 MED ORDER — SERTRALINE HCL 25 MG PO TABS
25.0000 mg | ORAL_TABLET | Freq: Every day | ORAL | Status: DC
  Administered 2019-03-30 – 2019-04-01 (×3): 25 mg via ORAL
  Filled 2019-03-30 (×4): qty 1

## 2019-03-30 MED ORDER — OXYCODONE HCL 5 MG PO TABS
5.0000 mg | ORAL_TABLET | Freq: Four times a day (QID) | ORAL | Status: DC | PRN
  Administered 2019-03-30 – 2019-03-31 (×2): 5 mg via ORAL
  Filled 2019-03-30 (×2): qty 1

## 2019-03-30 NOTE — Lactation Note (Signed)
This note was copied from a baby's chart. Lactation Consultation Note  Patient Name: Veronica Bass Today's Date: 03/30/2019   P4,Twins 54 hours old in couplet.  [redacted]w[redacted]d.  [name] < 6 lbs NPO.  [name] < 5 lbs.  Mother is experienced w/ breastfeeding.  She breastfed her other children for 15 mos. She states she had an overproduction of breastmilk. [name] recently bf for 20 min per mother.   Upon entering she was finger feeding Neosure 22 cal.  She recently pumped 3 ml of breastmilk so assisted mother w/ finger feeding volume to baby.  Baby took another 5 ml of formula.  Mother has 2 personal DEBP at home. Provided mother with NICU booklet to read and lactation brochure for services.  Discussed cleaning and milk storage.   Plan: 1. Keep baby STS as much as possible  2. Offer breast when baby cues that he/she is hungry, or awaken baby for feeding at 3 hrs. 3.  Breastfeed baby, asking for help prn.  Limit to 20 mins so not to overtire baby. 4. If baby does not latch after 10 min of attempt - give supplemental breastmilk/formula.  Slow flow nipple bottle option discussed along w/ paced feeding.  5.  Pump both breasts 15-20 minutes on initiation setting, adding breast massage and hand expression to collect as much colostrum as possible to feed baby.  Recommend q 2-3 hours during the day and q 4hours at night.  6.  Feed baby 5-10 ml EBM+/formula after breastfeeding per LPTI volume guidelines increasing per day of life and as baby desires.           Maternal Data    Feeding Feeding Type: Breast Fed  LATCH Score Latch: Grasps breast easily, tongue down, lips flanged, rhythmical sucking.  Audible Swallowing: A few with stimulation  Type of Nipple: Everted at rest and after stimulation  Comfort (Breast/Nipple): Soft / non-tender  Hold (Positioning): No assistance needed to correctly position infant at breast.  LATCH Score: 9  Interventions Interventions: Breast feeding basics  reviewed;DEBP;Hand pump  Lactation Tools Discussed/Used     Consult Status Consult Status: Follow-up Date: 03/31/19 Follow-up type: In-patient    Vivianne Master [facility] 03/30/2019, 9:55 AM

## 2019-03-30 NOTE — Progress Notes (Signed)
Pt produced 74mL clear amber urine in two hours. RN has discontinued pit and started LR orders.  Encouraged pt increase oral fluids. Left voicemail with CNM to request bolus, waiting for response.  Will continue to monitor.

## 2019-03-30 NOTE — Progress Notes (Signed)
Subjective: Postpartum Day 1,  Cesarean Delivery. Mono-Di twins 35.4 wks.  Pain was worse, but better since taking Oxycodone IR, Tylenol, Ibuprofen didn't help  Voiding well, tolerating reg diet, no heavy bleeding. Gas pain +  Objective: BP 114/77 (BP Location: Right Arm)   Pulse 68   Temp 98.1 F (36.7 C) (Oral)   Resp 18   Ht 5\' 7"  (1.702 m)   Wt 85.3 kg   LMP 07/24/2018   SpO2 99%   Breastfeeding Unknown   BMI 29.46 kg/m   Vital signs in last 24 hours: Temp:  [97.5 F (36.4 C)-99.6 F (37.6 C)] 98.1 F (36.7 C) (08/14 1210) Pulse Rate:  [60-71] 68 (08/14 1210) Resp:  [16-23] 18 (08/14 1210) BP: (106-136)/(57-98) 114/77 (08/14 1210) SpO2:  [98 %-100 %] 99 % (08/14 1210)  Physical Exam:  General: alert and cooperative  Lungs CTA CV RRR Abdo gaseous distention, NABS.  Uterine Fundus: firm Incision: no significant drainage, no significant erythema DVT Evaluation: No evidence of DVT seen on physical exam.  CBC Latest Ref Rng & Units 03/30/2019 03/29/2019  WBC 4.0 - 10.5 K/uL 9.3 9.3  Hemoglobin 12.0 - 15.0 g/dL 11.2(L) 12.7  Hematocrit 36.0 - 46.0 % 33.6(L) 38.1  Platelets 150 - 400 K/uL 141(L) 140(L)   O(+) Rub Imm  Assessment/Plan: Status post Cesarean section. Doing well postoperatively.  Add Oxycodone prn with Tylenol and Ibuprofen.  Breast pumping now.  Kids stable- NICU  Post-op care and warning s/s reviewed   Elveria Royals 03/30/2019, 1:42 PM

## 2019-03-30 NOTE — Progress Notes (Signed)
No response from CNM. Urine output improving, color has lightened to yellow/clear as well.  Continuous LR running, RN will continue to encourage oral fluids.  Will continue to monitor.

## 2019-03-31 MED ORDER — BACITRACIN ZINC 500 UNIT/GM EX OINT
TOPICAL_OINTMENT | CUTANEOUS | Status: DC | PRN
  Administered 2019-03-31: 16:00:00 via TOPICAL
  Filled 2019-03-31: qty 28.4

## 2019-03-31 MED ORDER — OXYCODONE HCL 5 MG PO TABS
5.0000 mg | ORAL_TABLET | ORAL | Status: DC | PRN
  Administered 2019-03-31 (×2): 10 mg via ORAL
  Administered 2019-03-31 – 2019-04-01 (×2): 5 mg via ORAL
  Filled 2019-03-31 (×2): qty 2
  Filled 2019-03-31: qty 1
  Filled 2019-03-31: qty 2

## 2019-03-31 MED ORDER — ACETAMINOPHEN 500 MG PO TABS
1000.0000 mg | ORAL_TABLET | Freq: Four times a day (QID) | ORAL | Status: DC
  Administered 2019-03-31 – 2019-04-01 (×4): 1000 mg via ORAL
  Filled 2019-03-31 (×5): qty 2

## 2019-03-31 NOTE — Lactation Note (Signed)
This note was copied from a baby's chart. Lactation Consultation Note  Patient Name: Veronica Bass VCBSW'[id number] Date: 03/31/2019 Reason for consult: Follow-up assessment;NICU baby;Infant < 6lbs;Late-preterm 53-36.6wks  Visited with mom of a 52 hours old ETI NICU twins, she's been very diligent about pumping and supplementing on each feeding, she skipped one pumping session last night because she wasn't feeling well due to cramping. Mom is now pumping about 18 ml per pumping session combined, praised her for her efforts. Noticed that junctures on her pump were lose and adjusted them. Mom aware that she may feel a difference in the suction on posterior pumping sessions, reviewed again pump settings.  [name] He's < 6 lbs and on a respirator, he's also getting gavage feedings of donor milk. Asked mom to consult with baby's doctor to see if her EBM can be prioritized for this baby instead since she's now pumping more volume. Reviewed the benefits of premature milk.  [name] This baby is also getting supplemented, but now he's on nearly 100% mother's milk. Mom told LC that baby was getting tired nursing first and then too tired to take the supplement, so she's been doing it the other way around. She supplements with her EBM first, and then puts baby to the breast. She's also limiting the feedings to no more than 20 minutes at a time.  Feeding plan:  1. Offer breast STS when baby cues that he/she is hungry, or awaken baby for feeding at 3 hrs. 2.  Breastfeed baby, asking for help prn. Limit to 20 mins so not to overtire baby. 3.  Parents will continue supplementing with breastmilk/formula  20-30 ml EBM+/formula prior breastfeeding per       LPTI volume guidelines increasing per day of life and as baby desires.  4.  Pump both breasts 15-20 minutes on initiation setting, adding breast massage and hand expression to collect as much colostrum as possible to feed baby.  Recommend q 2-3 hours during the day  and q 4-6 hours at night.   Parents reported all questions and concerns were answered, they're both aware of [place] OP services and will call PRN.  Maternal Data    Feeding Feeding Type: Donor Breast Milk   Interventions Interventions: Breast feeding basics reviewed;DEBP  Lactation Tools Discussed/Used Tools: Pump Breast pump type: Double-Electric Breast Pump   Consult Status Consult Status: Follow-up Date: 04/01/19 Follow-up type: In-patient    [place] 03/31/2019, 2:42 PM

## 2019-03-31 NOTE — Progress Notes (Signed)
POSTOPERATIVE DAY # 2 S/P Repeat LTCS, Mono-Di Twins at 35+4 weeks,  [name] "Barnabas Lister" [name] "Barth Kirks"  S:         Reports pain is somewhat better, took 2 Percocet this morning, but wants to try scheduled Tylenol and Motrin, with Oxy IR as needed and staggering medications             Tolerating po intake / no nausea / no vomiting / + passing more flatus within the past hour, has been using heating pad and prune juice with relief / no BM  Denies dizziness, SOB, or CP             Bleeding is moderate, passed 1 clot             Up ad lib / ambulatory/ voiding QS without difficulty  Newborns are stable in NICU, breast feeding with formula supplementing, and mom is pumping colostrum     O:  VS: BP 115/67 (BP Location: Right Arm)   Pulse 79   Temp 98.2 F (36.8 C) (Oral)   Resp 16   Ht 5\' 7"  (1.702 m)   Wt 85.3 kg   LMP 07/24/2018   SpO2 99%   Breastfeeding Unknown   BMI 29.46 kg/m    LABS:               Recent Labs    03/29/19 1119 03/30/19 1158  WBC 9.3 9.3  HGB 12.7 11.2*  PLT 140* 141*               Bloodtype: --/--/O POS, O POS (08/11 0920)  Rubella: Immune (02/21 0000)                                             I&O: Intake/Output      08/14 0701 - 08/15 0700 08/15 0701 - 08/16 0700   I.V. (mL/kg)     Total Intake(mL/kg)     Urine (mL/kg/hr) 500 (0.2)    Blood     Total Output 500    Net -500                      Physical Exam:             Alert and Oriented X3  Lungs: Clear and unlabored  Heart: regular rate and rhythm / no murmurs  Abdomen: soft, non-tender, mild gaseous distention, active bowel sounds in all quadrants             Fundus: firm, non-tender, U-3             Dressing: honeycomb with steri-strips c/d/i              Incision:  approximated with sutures / no erythema / no ecchymosis / no drainage  Perineum: intact  Lochia: small  Extremities: +1 LE and pedal edema, no calf pain or tenderness,   A:        POD # 2 S/P Repeat LTCS, Mono-Di Twins          Mild ABL Anemia - stable on oral FE  Dependent edema   Thrombocytopenia - stable   Hx. Of Depression/anxiety - stable on Zoloft 25mg   P:        Routine postoperative care              TED  hose bilaterally, elevate feet, increase hydration  Discussed continuing warm prune juice, ambulation encouraged, and heating pads/warm showers as needed  Change medications for pain: Tylenol 1000mg  PO every 6 hrs, Oxycodone 5-10mg  PO every 4hrs PRN for severe pain   See lactation  Continue current care   Carlean JewsMeredith Orrie Lascano, MSN, CNM [facility]

## 2019-04-01 ENCOUNTER — Other Ambulatory Visit: Payer: Self-pay

## 2019-04-01 ENCOUNTER — Encounter (HOSPITAL_COMMUNITY): Payer: Self-pay | Admitting: *Deleted

## 2019-04-01 MED ORDER — IBUPROFEN 800 MG PO TABS: 800.0000 mg | ORAL_TABLET | Freq: Three times a day (TID) | ORAL | 0 refills | Status: DC

## 2019-04-01 MED ORDER — ACETAMINOPHEN 500 MG PO TABS: 1000.0000 mg | ORAL_TABLET | Freq: Four times a day (QID) | ORAL | 0 refills | Status: DC

## 2019-04-01 MED ORDER — OXYCODONE HCL 5 MG PO TABS: 5.0000 mg | ORAL_TABLET | ORAL | 0 refills | Status: DC | PRN

## 2019-04-01 NOTE — Progress Notes (Signed)
POSTOPERATIVE DAY # 3 S/P Repeat LTCS, Mono-Di Twins at 35+4 weeks,   S:         Feels well, feet swollen since chair does not have foot support and are dangling              Tolerating po intake / no nausea / no vomiting / + passing more flatus within the past hour, has been using heating pad and prune juice with relief / no BM  Denies dizziness, SOB, or CP             Bleeding is moderate, passed 1 clot             Up ad lib / ambulatory/ voiding QS without difficulty  Newborns are stable in NICU, breast feeding with formula supplementing, and mom is pumping colostrum     O:  VS: BP 123/71 (BP Location: Right Arm)   Pulse 70   Temp 98.4 F (36.9 C) (Oral)   Resp 18   Ht 5\' 7"  (1.702 m)   Wt 85.3 kg   LMP 07/24/2018   SpO2 98%   Breastfeeding Unknown   BMI 29.46 kg/m    LABS:        CBC Latest Ref Rng & Units 03/30/2019 03/29/2019   WBC 4.0 - 10.5 K/uL 9.3 9.3   Hemoglobin 12.0 - 15.0 g/dL 11.2(L) 12.7   Hematocrit 36.0 - 46.0 % 33.6(L) 38.1   Platelets 150 - 400 K/uL 141(L) 140(L)                       Bloodtype: --/--/O POS, O POS (08/11 0920)  Rubella: Immune (02/21 0000)                                  Physical Exam:             Alert and Oriented X3  Lungs: Clear and unlabored  Heart: regular rate and rhythm / no murmurs  Abdomen: soft, non-tender, mild gaseous distention, active bowel sounds in all quadrants             Fundus: firm, non-tender, U-3             Dressing: honeycomb with steri-strips c/d/i              Incision:  approximated with sutures / no erythema / no ecchymosis / no drainage  Perineum: intact  Lochia: small  Extremities: +1 LE and pedal edema, no calf pain or tenderness,   A:        POD # 3 S/P Repeat LTCS, Mono-Di Twins            Mild ABL Anemia - stable on oral FE  Dependent edema   Thrombocytopenia - stable   Hx. Of Depression/anxiety - stable on Zoloft 25mg   P:        Routine postoperative care              TED hose bilaterally,  elevate feet, increase hydration  Discussed continuing warm prune juice, ambulation encouraged, and heating pads/warm showers as needed  Change medications for pain: Tylenol 1000mg  PO every 6 hrs, Oxycodone 5-10mg  PO every 4hrs PRN for severe pain   See lactation   Discharge home. F/up Dr Pamala Hurry 6 wks and sooner as needed.  Postop and PP care and warning s/s reviewed.   V.Tiannah Greenly, MD

## 2019-04-01 NOTE — Lactation Note (Signed)
This note was copied from a baby's chart. Lactation Consultation Note  Patient Name: Veronica Bass [id number] Date: 04/01/2019 Reason for consult: Follow-up assessment;NICU baby;Infant < 6lbs;Late-preterm 34-36.6wks;Multiple gestation  Baby  B "teddy"  Cueing after assessment from RN.  LC offered assist with bf.  Mom is filling.  Breasts fill full and tender is some areas even after recently pumping.    Infant latched easily, and spontaneous swallows heard; audible swallows especially with compression on full areas.  Infant actively sucking and rhythmic jaw movement noted.  After 20 minutes, infant relaxed and asleep.  Moms breast feel less full.  LC provided ice for breast.    LC discussed plan for mom to pump first, then bf infant and supplement after feed with bottle.  She noticed putting infant to breast before pump, her let down was fast and infant didn't feed as well.  Mom feels infant feeds better when breast is less full.  She had an over supply issue with previous two children.  LC encouraged mom to hand express to alleviate fullness for engorgement prevention.  Discussed hands on pumping; mom understands.  Mom will pump every 2-3 hours during the day, then go a 4 hour stretch at night for added rest.   Infant will go to breast after pump, feed 20 minutes at breast, then will be bottle fed EBM.    Resouces given to mom for lactation OP service and support group. LC encouraged mom to call out for further questions.  Maternal Data    Feeding Feeding Type: Breast Fed Nipple Type: Nfant Slow Flow (purple)  LATCH Score Latch: Grasps breast easily, tongue down, lips flanged, rhythmical sucking.  Audible Swallowing: A few with stimulation  Type of Nipple: Everted at rest and after stimulation  Comfort (Breast/Nipple): Filling, red/small blisters or bruises, mild/mod discomfort  Hold (Positioning): No assistance needed to correctly position infant at breast.  LATCH Score:  8  Interventions Interventions: Breast feeding basics reviewed;Skin to skin;Breast massage;Hand express;Expressed milk;Adjust position;Breast compression;Support pillows  Lactation Tools Discussed/Used Tools: Pump;Bottle Breast pump type: Double-Electric Breast Pump   Consult Status Consult Status: Follow-up Date: 04/02/19 Follow-up type: In-patient    Veronica Bass [facility] 04/01/2019, 9:14 AM

## 2019-04-01 NOTE — Discharge Summary (Signed)
Obstetric Discharge Summary Reason for Admission: cesarean section 35.4 wks, Monochorionic-Diamniotic twins  Prenatal Procedures: Serial ultrasounds, antenatal testing due to Mono-Di twins  Intrapartum Procedures: Low transverse C-section  Postpartum Procedures: none Complications-Operative and Postpartum: none Hemoglobin  Date Value Ref Range Status  03/30/2019 11.2 (L) 12.0 - 15.0 g/dL Final   HCT  Date Value Ref Range Status  03/30/2019 33.6 (L) 36.0 - 46.0 % Final    Physical Exam:  General: alert and cooperative Lochia: appropriate Uterine Fundus: firm Incision: healing well DVT Evaluation: No evidence of DVT seen on physical exam.  Discharge Diagnoses: Preterm delivery at 35.4 wks due to Mono-Di twins  Discharge Information: Date: 04/01/2019 Activity: pelvic rest Diet: routine Medications: PNV, Ibuprofen, Percocet and Zoloft Condition: stable Instructions: refer to practice specific booklet Discharge to: home Follow-up Information    Aloha Gell, MD Follow up in 6 week(s).   Specialty: Obstetrics and Gynecology Contact information: [place] [id number] [phone]           Newborn Data:   Veronica, Bass [[id number]]  Live born female  Birth Weight: 5 lb 5.4 oz (2420 g) APGAR: 75, 8  Newborn Delivery   Birth date/time: 03/29/2021 13:03:00 Delivery type: C-Section, Low Transverse Trial of labor: No C-section categorization: Repeat       Veronica, Bass [[id number]]  Live born female  Birth Weight: 4 lb 6.7 oz (2005 g) APGAR: 8, 9  Newborn Delivery   Birth date/time: 03/29/2021 13:04:00 Delivery type: C-Section, Low Transverse Trial of labor: No C-section categorization: Repeat   One baby NICU and one baby Nursery but not yet discharged    Veronica Bass 04/01/2019, 1:44 PM

## 2019-04-02 ENCOUNTER — Ambulatory Visit: Payer: Self-pay

## 2019-04-02 NOTE — Lactation Note (Signed)
This note was copied from a baby's chart. Lactation Consultation Note  Patient Name: Veronica Bass [id number] Date: 04/02/2019 Reason for consult: NICU baby;Follow-up assessment;Multiple gestation;Infant weight loss;Infant < 6lbs;Late-preterm 30-36.6wks  Visited with mom of a 24 hours old twins, [name] was supposed to go home today but there was an issue with baby's temps and the discharge was withheld. Mom doing STS with [name] when entering the room, [name] was swaddled in this bassinet.  Mom has been pumping every 2-3 hours, she got 4 1/2 ounces on her last pumping session. Mom has a history of oversupply and she's please to inform that she's producing so much milk for both babies that she doesn't have to rely on any donor milk or formula anymore.   [name] Dr. Angelena Sole for [name] to start going to the breast today, but mom not ready to take him during [facility] consultation, she'll do it later, she's very experienced BF.  [name] He's been nursing on cues and also getting some EBM after feedings.   Feeding plan:  1. Offer breast STS when babies are hungry, or awaken baby for feeding at 3 hrs. 2. Breastfeed baby, asking for help prn. Limit to60mins so not to overtire baby. 3.  Parents will continue supplementing with EBM per LPTI volume guidelines increasing and as baby desires.  4. Pump both breasts 15-20 minutes on initiation setting, adding breast massage and hand expression to collect as much colostrum as possible to feed baby. Recommend q 2-3 hours during the day and q 4-6 hours at night.  Parents reported all questions and concerns were answered, they're both aware of [place] OP services and will call PRN.   Maternal Data    Feeding Feeding Type: Breast Fed   Interventions Interventions: Breast feeding basics reviewed  Lactation Tools Discussed/Used     Consult Status Consult Status: Follow-up Date: 04/03/19 Follow-up type: In-patient    Veronica Bass Veronica Bass 04/02/2019,  12:36 PM

## 2019-04-03 ENCOUNTER — Ambulatory Visit: Payer: Self-pay

## 2019-04-03 NOTE — Lactation Note (Signed)
This note was copied from a baby's chart. Lactation Consultation Note  Patient Name: Veronica Bass Today's Date: 04/03/2019     [facility] Follow Up Visit:  Attempted to visit with mother, however, she was asleep.  Will attempt to return later today.  RN notified to call me if mother needs assistance.    Ilianna Bown R Tell Rozelle 04/03/2019, 4:42 PM

## 2019-04-04 ENCOUNTER — Ambulatory Visit: Payer: Self-pay

## 2019-04-04 NOTE — Lactation Note (Addendum)
This note was copied from a baby's chart. Lactation Consultation Note  Patient Name: Miangel Flom [id number]'[id number] Date: 04/04/2019   Baby 13 days old.  10 voids/3 stools in the last 24 hours.  Now [redacted]w[redacted]d.  Received phone call from [facility] MD regarding mother's overproduction of breastmilk and how to manage to help [name] gain weight by receiving increased volume of hindmilk. Also spoke with mother, RN and SLP. Mother has a history of block feeding with her other children due to overproduction.   Plan to help baby receive more hindmilk, gain weight and not overtire.     Mother will prepump for approx 5 min and set breastmilk aside.   Mother will alternate feedings at breast with bottle feeding, prepumping before both.  SLP confirmed that baby can alternate feedings with 1.  up to 30 min of breastfeeding and 2.  a bottle of breastmilk at next feeding.  LC suggested mother bottle feed first and then allow baby to breastfeed if he still has energy keeping feeding to 30 min.  But RN states that with NICU feeding policy baby cannot breastfeed and bottle feed in the same feeding.   Per RN baby will now feed q 3 hours, not on demand.       Maternal Data    Feeding Feeding Type: Breast Milk Nipple Type: Nfant Slow Flow (purple)  LATCH Score                   Interventions    Lactation Tools Discussed/Used     Consult Status      Carlye Grippe 04/04/2019, 3:21 PM

## 2019-04-05 ENCOUNTER — Ambulatory Visit: Payer: Self-pay

## 2019-04-05 NOTE — Lactation Note (Signed)
This note was copied from a baby's chart. Lactation Consultation Note  Patient Name: Veronica Bass [id number] Date: 04/05/2019 Reason for consult: Follow-up assessment;NICU baby;Late-preterm 34-36.6wks  I followed up with Ms. [place] and reviewed plan set forth by [facility] on 8/19. She states that she is alternating between breast feeding and bottle feeding each baby. Both babies are beginning to latch, and she pre-pumps prior to putting them to breast. Ms. Kagel and I discussed pumping volume amounts to feed twins. She pumped 6 ounces at her last pump. I encouraged her to call for lactation as needed. She is aware of our OP lactation resources.  Feeding Feeding Type: Breast Milk Nipple Type: Nfant Slow Flow (purple)   Interventions Interventions: Breast feeding basics reviewed   Consult Status Consult Status: PRN Follow-up type: Call as needed    Lenore Manner 04/05/2019, 5:41 PM

## 2020-09-07 IMAGING — US US MFM FETAL BPP WO NON STRESS
1 series · 14 of 28 positions shown · non-contrast
Comparison: none

[Series 1: us mfm fetal bpp wo non stress · 77 acquisitions, 14 frames shown]
[im 3/77]
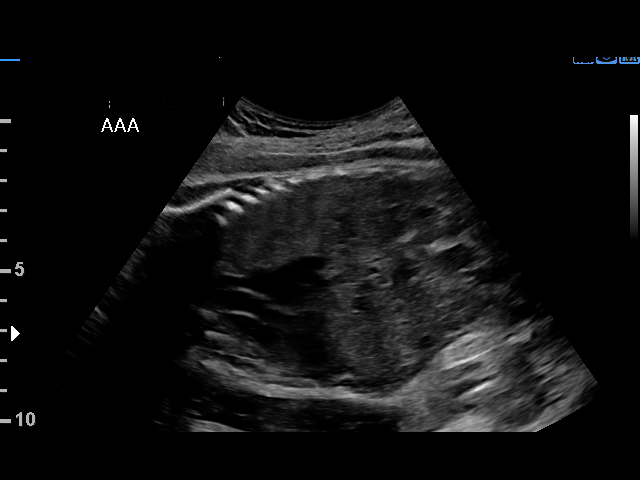
[im 9/77]
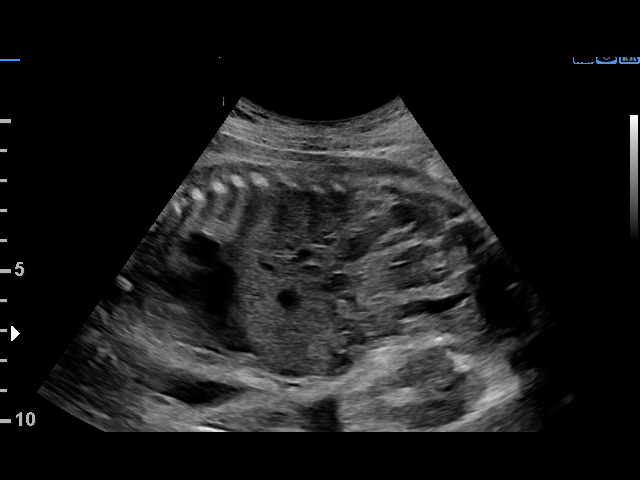
[im 15/77]
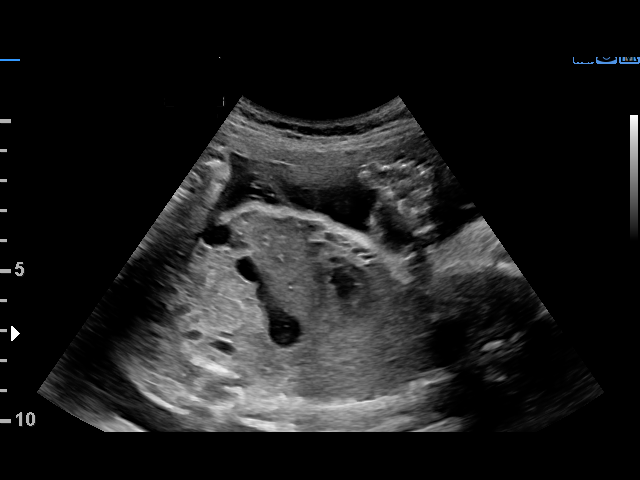
[im 20/77]
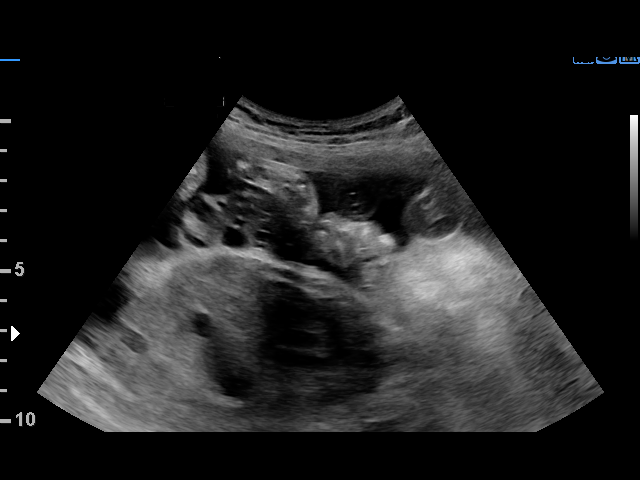
[im 26/77]
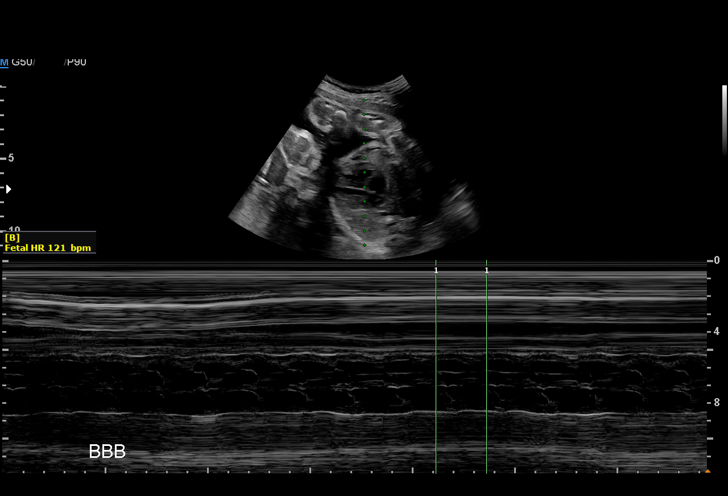
[im 31/77]
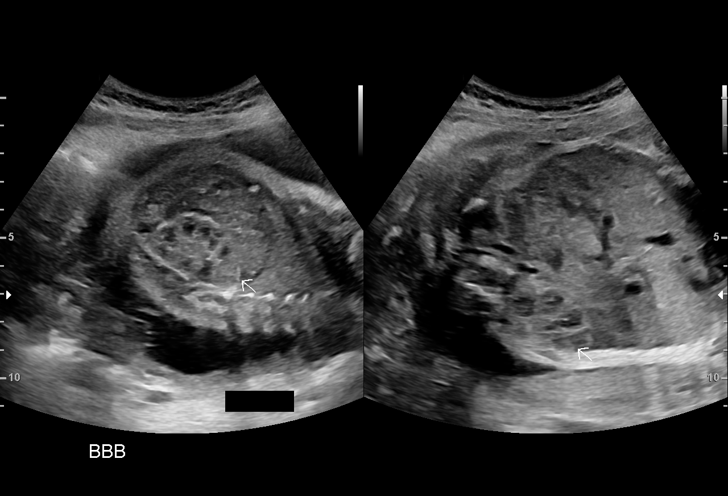
[im 37/77]
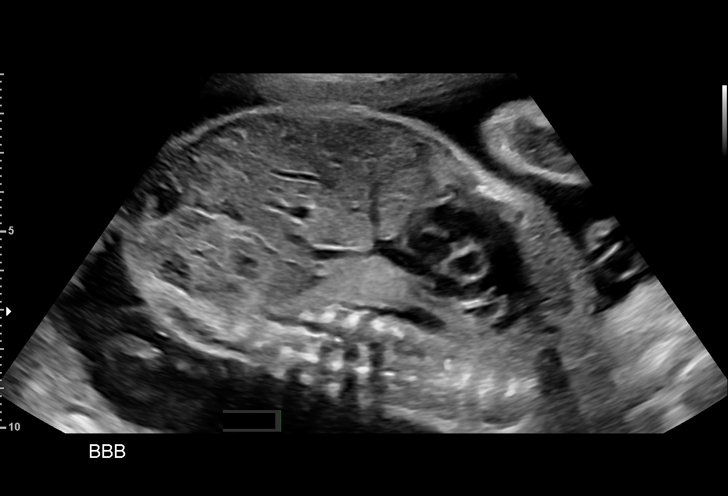
[im 43/77]
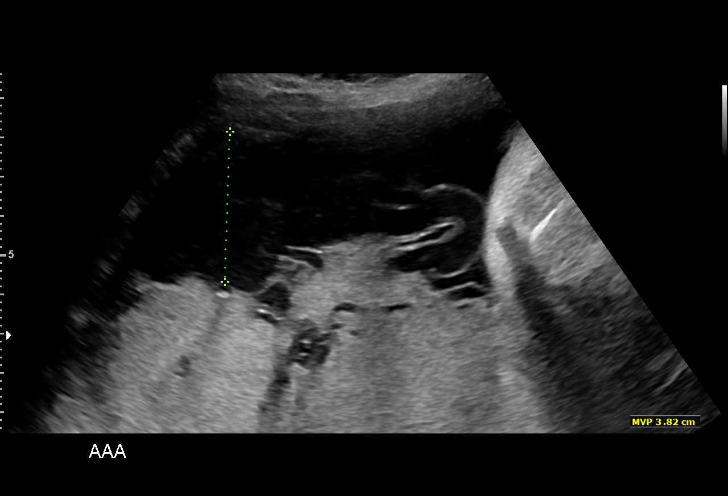
[im 48/77]
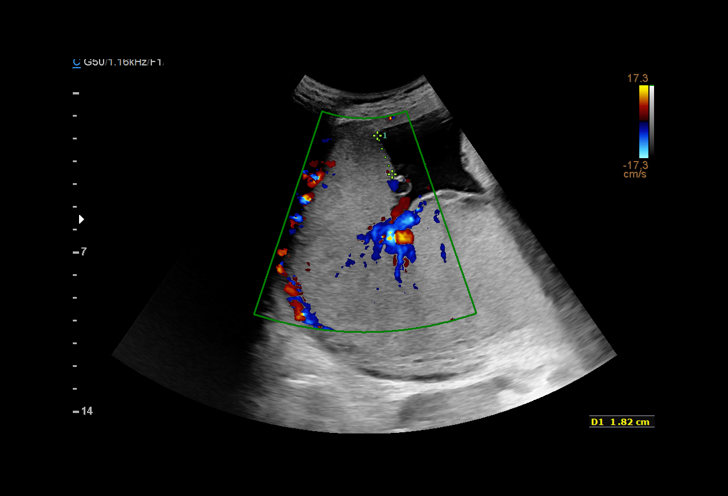
[im 54/77]
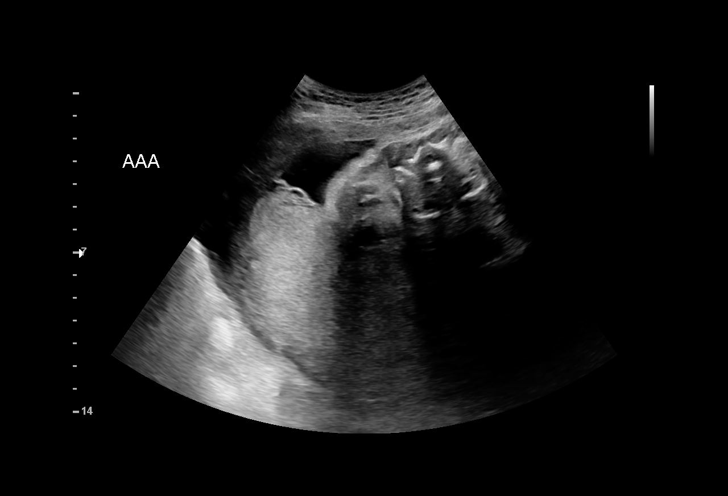
[im 60/77]
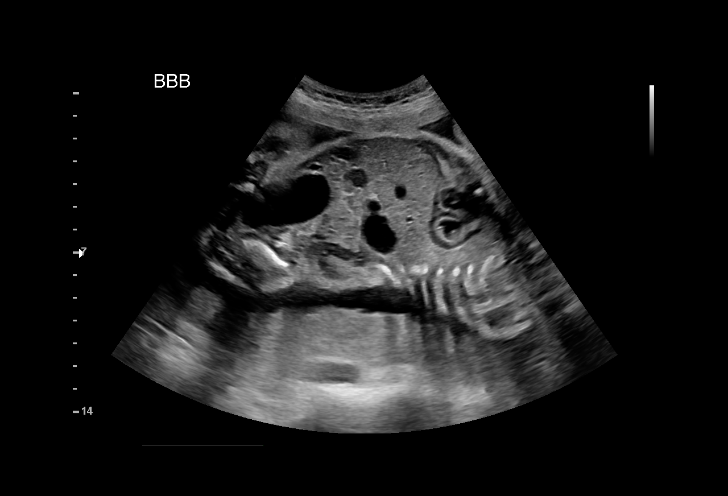
[im 65/77]
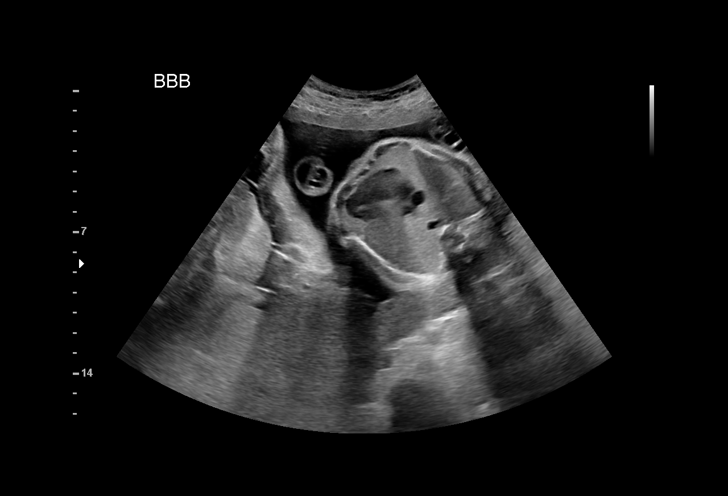
[im 71/77]
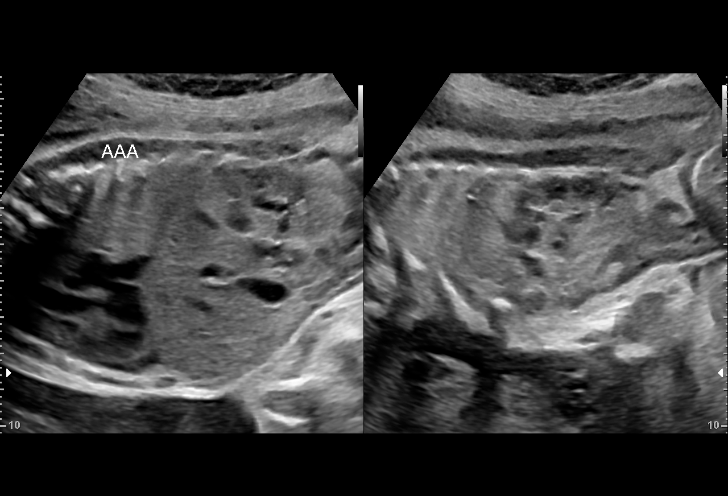
[im 77/77]
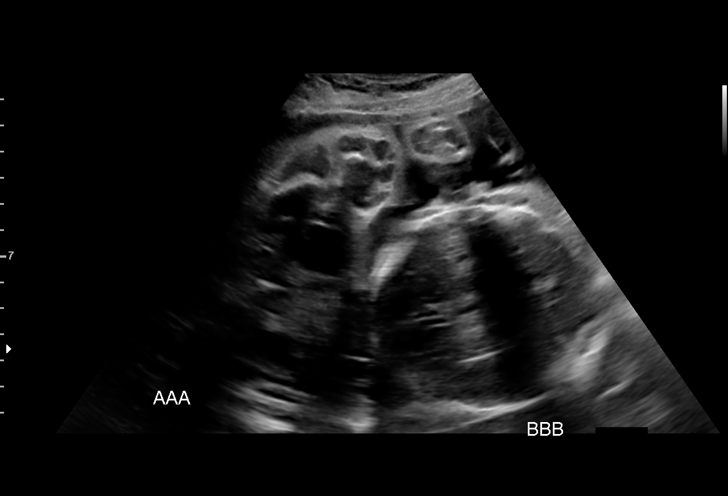

[14 of 28 positions shown; findings below may reference images not displayed]

[REDACTED]

     ADDL GESTATION
 ----------------------------------------------------------------------

 ----------------------------------------------------------------------
Indications

  Decreased fetal movements, third trimester,
  unspecified
  Twin pregnancy, Paty/Zasaq, second trimester
  30 weeks gestation of pregnancy
 ----------------------------------------------------------------------
Fetal Evaluation (Fetus A)

 Num Of Fetuses:         2
 Fetal Heart Rate(bpm):  135
 Cardiac Activity:       Observed
 Fetal Lie:              Maternal right side
 Presentation:           Breech, footling
 Placenta:               Posterior
 P. Cord Insertion:      Visualized, central

 Amniotic Fluid
 AFI FV:      Subjectively within normal limits

                             Largest Pocket(cm)

Biophysical Evaluation (Fetus A)

 Amniotic F.V:   Within normal limits       F. Tone:        Observed
 F. Movement:    Observed                   Score:          [DATE]
 F. Breathing:   Observed
OB History

 Gravidity:    3         Term:   2        Prem:   0        SAB:   0
 TOP:          0       Ectopic:  0        Living: 20
Gestational Age (Fetus A)

 LMP:           30w 6d        Date:  07/24/18                 EDD:   04/30/19
 Best:          30w 6d     Det. By:  LMP  (07/24/18)          EDD:   04/30/19
Anatomy (Fetus A)

 Ventricles:            Appears normal         Abdominal Wall:         Appears nml (cord
                                                                       insert, abd wall)
 Heart:                 Appears normal         Kidneys:                Appear normal
                        (4CH, axis, and
                        situs)
 Stomach:               Appears normal, left   Bladder:                Appears normal
                        sided

Fetal Evaluation (Fetus B)

 Num Of Fetuses:         2
 Fetal Heart Rate(bpm):  119
 Cardiac Activity:       Observed
 Fetal Lie:              Maternal left side
 Presentation:           Cephalic
 Placenta:               Posterior
 P. Cord Insertion:      Not well visualized

 Amniotic Fluid
 AFI FV:      Subjectively within normal limits

                             Largest Pocket(cm)

Biophysical Evaluation (Fetus B)

 Amniotic F.V:   Within normal limits       F. Tone:        Observed
 F. Movement:    Observed                   Score:          [DATE]
 F. Breathing:   Observed
Gestational Age (Fetus B)

 LMP:           30w 6d        Date:  07/24/18                 EDD:   04/30/19
 Best:          30w 6d     Det. By:  LMP  (07/24/18)          EDD:   04/30/19
Anatomy (Fetus B)

 Heart:                 Appears normal         Abdominal Wall:         Appears nml (cord
                        (4CH, axis, and                                insert, abd wall)
                        situs)
 RVOT:                  Appears normal         Kidneys:                Appear normal
 LVOT:                  Appears normal         Bladder:                Appears normal
 Stomach:               Appears normal, left
                        sided
Impression

 Monochorionic-diamniotic twin pregnancy.

 Twin A: Maternal right, breech footling, posterior placenta.
 Amniotic fluid is normal and good fetal activity is seen. Fetal
 bladder appears normal. Antenatal testing is reassuring. BPP
 [DATE].

 Twin B: Maternal left, cephalic, posterior placenta. Amniotic
 fluid is normal and good fetal activity is seen. Fetal bladder
 appears normal. Antenatal testing is reassuring. BPP [DATE].

 No evidence of twin-to-twin transfusion syndrome.
                 Isaacs, Teisha

## 2021-03-09 ENCOUNTER — Other Ambulatory Visit: Payer: Self-pay | Admitting: Obstetrics

## 2021-03-09 ENCOUNTER — Other Ambulatory Visit (HOSPITAL_COMMUNITY): Payer: Self-pay | Admitting: Obstetrics

## 2021-03-09 DIAGNOSIS — O021 Missed abortion: Secondary | ICD-10-CM

## 2021-03-10 ENCOUNTER — Other Ambulatory Visit: Payer: Self-pay | Admitting: Obstetrics

## 2021-03-10 ENCOUNTER — Other Ambulatory Visit: Payer: Self-pay

## 2021-03-10 ENCOUNTER — Encounter (HOSPITAL_BASED_OUTPATIENT_CLINIC_OR_DEPARTMENT_OTHER): Payer: Self-pay | Admitting: Obstetrics

## 2021-03-10 NOTE — Progress Notes (Signed)
Spoke w/ via phone for pre-op interview---PT Lab needs dos----pending MD orders               Lab results------none COVID test -----patient states asymptomatic no test needed Arrive at -------1130 NPO after MN NO Solid Food.  Clear liquids from MN until---1015 Med rec completed Medications to take morning of surgery -----buspar, zoloft Diabetic medication -----none Patient instructed no nail polish to be worn day of surgery Patient instructed to bring photo id and insurance card day of surgery Patient aware to have Driver (ride ) / (husband) Anne Ng) caregiver    for 24 hours after surgery  Patient Special Instructions -----none Pre-Op special Istructions -----none Patient verbalized understanding of instructions that were given at this phone interview. Patient denies shortness of breath, chest pain, fever, cough at this phone interview.  Patient requested information on receiving her fetus after the procedure for burial or cremation. Remo Lipps [facility] assistant director is assisting with gathering the appropriate information we need to assist this patient.

## 2021-03-11 NOTE — Anesthesia Preprocedure Evaluation (Addendum)
Anesthesia Evaluation  Patient identified by MRN, date of birth, ID band Patient awake    Reviewed: Allergy & Precautions, NPO status , Patient's Chart, lab work & pertinent test results  History of Anesthesia Complications Negative for: history of anesthetic complications  Airway Mallampati: II  TM Distance: >3 FB Neck ROM: Full    Dental no notable dental hx. (+) Teeth Intact, Dental Advisory Given   Pulmonary neg pulmonary ROS,    Pulmonary exam normal breath sounds clear to auscultation       Cardiovascular negative cardio ROS Normal cardiovascular exam Rhythm:Regular Rate:Normal     Neuro/Psych  Headaches, PSYCHIATRIC DISORDERS Anxiety    GI/Hepatic Neg liver ROS, GERD  ,  Endo/Other  negative endocrine ROS  Renal/GU negative Renal ROS  negative genitourinary   Musculoskeletal negative musculoskeletal ROS (+)   Abdominal   Peds  Hematology negative hematology ROS (+)   Anesthesia Other Findings Missed AB at [redacted] weeks gestation  Reproductive/Obstetrics (+) Pregnancy (16w missed Ab)                           Anesthesia Physical Anesthesia Plan  ASA: 2  Anesthesia Plan: General   Post-op Pain Management:    Induction: Intravenous and Rapid sequence  PONV Risk Score and Plan: 3 and Treatment may vary due to age or medical condition, Midazolam, Ondansetron, Dexamethasone and Scopolamine patch - Pre-op  Airway Management Planned: Oral ETT  Additional Equipment: None  Intra-op Plan:   Post-operative Plan: Extubation in OR  Informed Consent:   Plan Discussed with:   Anesthesia Plan Comments:        Anesthesia Quick Evaluation

## 2021-03-12 ENCOUNTER — Ambulatory Visit (HOSPITAL_BASED_OUTPATIENT_CLINIC_OR_DEPARTMENT_OTHER)
Admission: RE | Admit: 2021-03-12 | Discharge: 2021-03-12 | Disposition: A | Discharge status: Home / Self Care | Payer: Self-pay | Attending: Obstetrics | Admitting: Obstetrics

## 2021-03-12 ENCOUNTER — Ambulatory Visit (HOSPITAL_COMMUNITY)
Admission: RE | Admit: 2021-03-12 | Discharge: 2021-03-12 | Disposition: A | Discharge status: Home / Self Care | Payer: Self-pay | Source: Ambulatory Visit | Attending: Obstetrics | Admitting: Obstetrics

## 2021-03-12 ENCOUNTER — Ambulatory Visit (HOSPITAL_BASED_OUTPATIENT_CLINIC_OR_DEPARTMENT_OTHER): Payer: Self-pay | Admitting: Anesthesiology

## 2021-03-12 ENCOUNTER — Encounter (HOSPITAL_BASED_OUTPATIENT_CLINIC_OR_DEPARTMENT_OTHER)
Admission: RE | Disposition: A | Discharge status: Home / Self Care | Payer: Self-pay | Source: Home / Self Care | Attending: Obstetrics

## 2021-03-12 ENCOUNTER — Other Ambulatory Visit: Payer: Self-pay

## 2021-03-12 ENCOUNTER — Encounter (HOSPITAL_BASED_OUTPATIENT_CLINIC_OR_DEPARTMENT_OTHER): Payer: Self-pay | Admitting: Obstetrics

## 2021-03-12 DIAGNOSIS — O021 Missed abortion: Secondary | ICD-10-CM | POA: Insufficient documentation

## 2021-03-12 HISTORY — DX: Gastro-esophageal reflux disease without esophagitis: K21.9

## 2021-03-12 HISTORY — PX: OPERATIVE ULTRASOUND: SHX5996

## 2021-03-12 HISTORY — PX: DILATION AND EVACUATION: SHX1459

## 2021-03-12 LAB — CBC
HCT: 41.2 % (ref 36.0–46.0)
Hemoglobin: 13.9 g/dL (ref 12.0–15.0)
MCH: 31.2 pg (ref 26.0–34.0)
MCHC: 33.7 g/dL (ref 30.0–36.0)
MCV: 92.4 fL (ref 80.0–100.0)
Platelets: 207 10*3/uL (ref 150–400)
RBC: 4.46 MIL/uL (ref 3.87–5.11)
RDW: 12.9 % (ref 11.5–15.5)
WBC: 7.4 10*3/uL (ref 4.0–10.5)
nRBC: 0 % (ref 0.0–0.2)

## 2021-03-12 LAB — TYPE AND SCREEN
ABO/RH(D): O POS
Antibody Screen: NEGATIVE

## 2021-03-12 SURGERY — DILATION AND EVACUATION, UTERUS, SECOND TRIMESTER
Anesthesia: General | Site: Uterus

## 2021-03-12 MED ORDER — OXYCODONE-ACETAMINOPHEN 5-325 MG PO TABS: 1.0000 | ORAL_TABLET | Freq: Four times a day (QID) | ORAL | 0 refills | Status: AC | PRN

## 2021-03-12 MED ORDER — KETOROLAC TROMETHAMINE 30 MG/ML IJ SOLN
INTRAMUSCULAR | Status: DC | PRN
  Administered 2021-03-12: 30 mg via INTRAVENOUS

## 2021-03-12 MED ORDER — ONDANSETRON HCL 4 MG/2ML IJ SOLN
INTRAMUSCULAR | Status: AC
  Filled 2021-03-12: qty 2

## 2021-03-12 MED ORDER — DROPERIDOL 2.5 MG/ML IJ SOLN
INTRAMUSCULAR | Status: AC
  Filled 2021-03-12: qty 2

## 2021-03-12 MED ORDER — ACETAMINOPHEN 500 MG PO TABS
1000.0000 mg | ORAL_TABLET | Freq: Once | ORAL | Status: AC
  Administered 2021-03-12: 1000 mg via ORAL

## 2021-03-12 MED ORDER — SODIUM CHLORIDE 0.9 % IV SOLN
100.0000 mg | Freq: Once | INTRAVENOUS | Status: AC
  Administered 2021-03-12: 100 mg via INTRAVENOUS
  Filled 2021-03-12: qty 100

## 2021-03-12 MED ORDER — FENTANYL CITRATE (PF) 100 MCG/2ML IJ SOLN: 25.0000 ug | INTRAMUSCULAR | Status: DC | PRN

## 2021-03-12 MED ORDER — CHLOROPROCAINE HCL 1 % IJ SOLN
INTRAMUSCULAR | Status: DC | PRN
  Administered 2021-03-12: 20 mL

## 2021-03-12 MED ORDER — SCOPOLAMINE 1 MG/3DAYS TD PT72
1.0000 | MEDICATED_PATCH | Freq: Once | TRANSDERMAL | Status: DC
  Administered 2021-03-12: 1.5 mg via TRANSDERMAL

## 2021-03-12 MED ORDER — TRANEXAMIC ACID-NACL 1000-0.7 MG/100ML-% IV SOLN
INTRAVENOUS | Status: DC | PRN
  Administered 2021-03-12: 1000 mg via INTRAVENOUS

## 2021-03-12 MED ORDER — LACTATED RINGERS IV SOLN: INTRAVENOUS | Status: DC

## 2021-03-12 MED ORDER — ROCURONIUM BROMIDE 10 MG/ML (PF) SYRINGE
PREFILLED_SYRINGE | INTRAVENOUS | Status: AC
  Filled 2021-03-12: qty 10

## 2021-03-12 MED ORDER — DEXAMETHASONE SODIUM PHOSPHATE 10 MG/ML IJ SOLN
INTRAMUSCULAR | Status: AC
  Filled 2021-03-12: qty 1

## 2021-03-12 MED ORDER — PROMETHAZINE HCL 25 MG/ML IJ SOLN: 6.2500 mg | INTRAMUSCULAR | Status: DC | PRN

## 2021-03-12 MED ORDER — FENTANYL CITRATE (PF) 100 MCG/2ML IJ SOLN
INTRAMUSCULAR | Status: AC
  Filled 2021-03-12: qty 2

## 2021-03-12 MED ORDER — OXYCODONE HCL 5 MG PO TABS: 5.0000 mg | ORAL_TABLET | Freq: Once | ORAL | Status: DC | PRN

## 2021-03-12 MED ORDER — POVIDONE-IODINE 10 % EX SWAB: 2.0000 "application " | Freq: Once | CUTANEOUS | Status: DC

## 2021-03-12 MED ORDER — KETOROLAC TROMETHAMINE 30 MG/ML IJ SOLN
INTRAMUSCULAR | Status: AC
  Filled 2021-03-12: qty 1

## 2021-03-12 MED ORDER — MIDAZOLAM HCL 2 MG/2ML IJ SOLN
INTRAMUSCULAR | Status: AC
  Filled 2021-03-12: qty 2

## 2021-03-12 MED ORDER — IBUPROFEN 800 MG PO TABS: 800.0000 mg | ORAL_TABLET | Freq: Three times a day (TID) | ORAL | 1 refills | Status: AC | PRN

## 2021-03-12 MED ORDER — ACETAMINOPHEN 500 MG PO TABS
ORAL_TABLET | ORAL | Status: AC
  Filled 2021-03-12: qty 2

## 2021-03-12 MED ORDER — FENTANYL CITRATE (PF) 100 MCG/2ML IJ SOLN
INTRAMUSCULAR | Status: DC | PRN
  Administered 2021-03-12 (×2): 50 ug via INTRAVENOUS

## 2021-03-12 MED ORDER — ONDANSETRON HCL 4 MG/2ML IJ SOLN
INTRAMUSCULAR | Status: DC | PRN
  Administered 2021-03-12: 4 mg via INTRAVENOUS

## 2021-03-12 MED ORDER — LIDOCAINE HCL (PF) 2 % IJ SOLN
INTRAMUSCULAR | Status: AC
  Filled 2021-03-12: qty 5

## 2021-03-12 MED ORDER — LIDOCAINE 2% (20 MG/ML) 5 ML SYRINGE
INTRAMUSCULAR | Status: DC | PRN
  Administered 2021-03-12: 50 mg via INTRAVENOUS

## 2021-03-12 MED ORDER — PROPOFOL 10 MG/ML IV BOLUS
INTRAVENOUS | Status: DC | PRN
  Administered 2021-03-12: 120 mg via INTRAVENOUS

## 2021-03-12 MED ORDER — SCOPOLAMINE 1 MG/3DAYS TD PT72
MEDICATED_PATCH | TRANSDERMAL | Status: AC
  Filled 2021-03-12: qty 1

## 2021-03-12 MED ORDER — MIDAZOLAM HCL 5 MG/5ML IJ SOLN
INTRAMUSCULAR | Status: DC | PRN
  Administered 2021-03-12: 2 mg via INTRAVENOUS

## 2021-03-12 MED ORDER — OXYCODONE HCL 5 MG/5ML PO SOLN: 5.0000 mg | Freq: Once | ORAL | Status: DC | PRN

## 2021-03-12 MED ORDER — TRANEXAMIC ACID-NACL 1000-0.7 MG/100ML-% IV SOLN
INTRAVENOUS | Status: AC
  Filled 2021-03-12: qty 100

## 2021-03-12 MED ORDER — ROCURONIUM BROMIDE 100 MG/10ML IV SOLN
INTRAVENOUS | Status: DC | PRN
  Administered 2021-03-12: 60 mg via INTRAVENOUS

## 2021-03-12 MED ORDER — DROPERIDOL 2.5 MG/ML IJ SOLN
0.6250 mg | Freq: Once | INTRAMUSCULAR | Status: AC
  Administered 2021-03-12: 0.625 mg via INTRAVENOUS

## 2021-03-12 MED ORDER — PROPOFOL 10 MG/ML IV BOLUS
INTRAVENOUS | Status: AC
  Filled 2021-03-12: qty 20

## 2021-03-12 MED ORDER — DEXAMETHASONE SODIUM PHOSPHATE 10 MG/ML IJ SOLN
INTRAMUSCULAR | Status: DC | PRN
  Administered 2021-03-12: 10 mg via INTRAVENOUS

## 2021-03-12 MED ORDER — SUGAMMADEX SODIUM 200 MG/2ML IV SOLN
INTRAVENOUS | Status: DC | PRN
  Administered 2021-03-12: 180 mg via INTRAVENOUS

## 2021-03-12 SURGICAL SUPPLY — 19 items
FILTER UTR ASPR ASSEMBLY (MISCELLANEOUS) ×2 IMPLANT
GAUZE 4X4 16PLY ~~LOC~~+RFID DBL (SPONGE) ×4 IMPLANT
GLOVE BIO SURGEON STRL SZ 6.5 (GLOVE) ×2 IMPLANT
GLOVE SURG UNDER POLY LF SZ7 (GLOVE) ×4 IMPLANT
GOWN STRL REUS W/ TWL LRG LVL3 (GOWN DISPOSABLE) ×2 IMPLANT
GOWN STRL REUS W/TWL LRG LVL3 (GOWN DISPOSABLE) ×4
HOSE CONNECTING 18IN BERKELEY (TUBING) ×2 IMPLANT
KIT BERKELEY 1ST TRIMESTER 3/8 (MISCELLANEOUS) IMPLANT
KIT BERKELEY 2ND TRIMESTER 1/2 (COLLECTOR) ×2 IMPLANT
KIT TURNOVER CYSTO (KITS) ×2 IMPLANT
NS IRRIG 1000ML POUR BTL (IV SOLUTION) ×2 IMPLANT
PACK VAGINAL MINOR WOMEN LF (CUSTOM PROCEDURE TRAY) ×2 IMPLANT
PAD OB MATERNITY 4.3X12.25 (PERSONAL CARE ITEMS) ×2 IMPLANT
TOWEL GREEN STERILE FF (TOWEL DISPOSABLE) ×2 IMPLANT
TUBE VACURETTE 2ND TRIMESTER (CANNULA) ×2 IMPLANT
UNDERPAD 30X36 HEAVY ABSORB (UNDERPADS AND DIAPERS) ×2 IMPLANT
VACURETTE 12 RIGID CVD (CANNULA) IMPLANT
VACURETTE 14MM CVD 1/2 BASE (CANNULA) IMPLANT
VACURETTE 16MM ASPIR CVD .5 (CANNULA) ×2 IMPLANT

## 2021-03-12 NOTE — H&P (Signed)
Chief complaint: Missed abortion  30 year old G4 P2-1-0-4 who presented for routine anatomy scan where she should have been 19 weeks with a pregnancy that was measuring 15 weeks and 2 days and breech presentation with a posterior placenta and no fetal heart tones.  This is a well dated pregnancy dated by LMP consistent with 6-week ultrasound.  This was unplanned but desired pregnancy.  The beginnings of the pregnancy were normal with normal labs and a normal panorama.  Her 3 prior pregnancies were 2 term cesarean sections and a preterm cesarean section at 34 weeks for mono ditwins which did need a TEE incision to the uterus.  Patient notes slight spotting which started last night.  Increased cramping over the last hour since she put 400 mcg of Cytotec in the vagina at 9:30 AM  Past medical history: Anxiety  Past Medical History:  Diagnosis Date   Anxiety    GERD (gastroesophageal reflux disease)    only during pregnancy   Headache    Heart murmur    hx of heart murmur   History of UTI    Seasonal allergies     Past Surgical History:  Procedure Laterality Date   CESAREAN SECTION N/A 03/26/2015   Procedure: CESAREAN SECTION;  Surgeon: Jaymes Graff, MD;  Location: [facility];  Service: Obstetrics;  Laterality: N/A;   CESAREAN SECTION N/A 07/04/2017   Procedure: Repeat CESAREAN SECTION;  Surgeon: Noland Fordyce, MD;  Location: [facility];  Service: Obstetrics;  Laterality: N/A;  EDD: 07/10/17   CESAREAN SECTION MULTI-GESTATIONAL N/A 03/29/2019   Procedure: Repeat CESAREAN SECTION MULTI-GESTATIONAL;  Surgeon: Noland Fordyce, MD;  Location: [facility] ORS;  Service: Obstetrics;  Laterality: N/A;  EDD: 04/30/19    Medications: Zoloft, BuSpar  Physical exam: Vitals:   03/10/21 0929 03/12/21 1208  BP:  128/85  Pulse:  79  Resp:  18  Temp:  98.3 F (36.8 C)  TempSrc:  Oral  SpO2:  100%  Weight: 71.7 kg 70.5 kg  Height: 5\' 6"  (1.676 m) 5\' 6"  (1.676 m)    General: Well-appearing  appropriately upset, no distress Skin: Warm and dry Abdomen: Soft, nontender GU deferred to the OR Lower extremity nontender, no edema  CBC    Component Value Date/Time   WBC 7.4 03/12/2021 1201   RBC 4.46 03/12/2021 1201   HGB 13.9 03/12/2021 1201   HCT 41.2 03/12/2021 1201   PLT 207 03/12/2021 1201   MCV 92.4 03/12/2021 1201   MCH 31.2 03/12/2021 1201   MCHC 33.7 03/12/2021 1201   RDW 12.9 03/12/2021 1201    O positive  Assessment and plan 30 year old G4 P2-1-0-4 with 15-week and may be here for ultrasound-guided D&C.  Patient aware risk benefits of surgical evacuation of the uterus especially given her 3 prior cesarean sections.  Patient declines genetic testing but does want funeral home to pick up the remains for cremation.  Doxycycline prophylaxis  03/14/2021 03/12/2021 1:45 PM

## 2021-03-12 NOTE — Op Note (Signed)
03/12/2021  2:37 PM  PATIENT:  Veronica Bass  30 y.o. female  PRE-OPERATIVE DIAGNOSIS:  Missed Abortion 16weeks  POST-OPERATIVE DIAGNOSIS:  Missed Abortion 16weeks  PROCEDURE:  Procedure(s): DILATATION AND EVACUATION (D&E) 2ND TRIMESTER (N/A) OPERATIVE ULTRASOUND (N/A)  SURGEON:  Surgeon(s) and Role:    * Noland Fordyce, MD - Primary  PHYSICIAN ASSISTANT: None  ASSISTANTS: none   ANESTHESIA:   local and general  EBL:  300 mL   BLOOD ADMINISTERED:none  DRAINS: none   LOCAL MEDICATIONS USED:  MARCAINE     SPECIMEN: Products of conception  DISPOSITION OF SPECIMEN:  PATHOLOGY  COUNTS:  YES  TOURNIQUET:  * No tourniquets in log *  DICTATION: .Note written in EPIC  PLAN OF CARE: Discharge to home after PACU  PATIENT DISPOSITION:  PACU - hemodynamically stable.   Delay start of Pharmacological VTE agent (>24hrs) due to surgical blood loss or risk of bleeding: yes   Antibiotic: 100 mg of IV doxycycline Findings: 16 weeks size uterus preprocedure, decreased uterine size post procedure with adequate hemostasis. thin endometrial stripe at 0.8 cm with no flow suggestive retained products of conception Complications: None Indications: A 30 year old G4 P2-1-0-4 who presented for ultrasound-guided D&C for missed abortion at 15+ weeks.  Routine OB visit no fetal heart tones were found on ultrasound and a fetal demise at 15+ weeks was noted.  Patient was given options for induction of labor versus expectant management versus surgical DNA and chose the latter.Risks benefits were discussed with patient who agreed to proceed.   Procedure: After informed was obtained the patient was taken to the operating room where general anesthesia was initiated without difficulty. She was prepped and draped in the normal fashion in dorsal supine lithotomy position.  No bladder drainage was done due  to the desire for a full bladder for improved ultrasound guidance. A bimanual examination was  performed to assess the size and the position of the uterus. A sterile speculum was placed into the vagina.  10 cc of quarter percent Marcaine were placed at 5 and 7:00 in the cervical paracervical junction.  A single-tooth tenaculum was used to grasp the anterior lip of the cervix.  Membranes were ruptured during the vaginal prep.  The cervix was serially dilated with Shawnie Pons dilators to a #47. A 16 French suction curet was advanced past the internal os under ultrasound guidance. Suction curettage was carried out.  Initially the curette did not remove any tissue due to a large piece of placenta clogging the tubing.  The curette was removed the placental tissue was removed with a ring forcep.  Several additional passes were made with the curette but again poor suction due to fetal tissue clogging the curette.  Using a ring forcep and with ultrasound guidance the fetal tissue was serially grasped and removed with several identifiable fetal parts being removed.  Attempt again with the curette but again no tissue was coming through the tubing.  The fetal calvarium was unable to come through the tubing.  With ultrasound guidance a ring forcep this was grasped and eventually brought out through the cervix.  At this point the 26 French suction curette was used to make 2 additional passes to remove additional blood and products of conception.  The uterine stripe appeared thin by ultrasound.  A sharp curette was used to gently palpate the walls and a gritty cry was noted.  Stripe was 0.8 cm with no flow. Hemostasis was noted and the decision was made to end the  procedure. Ultrasound was used to confirm a thin endometrial stripe and no blood flow. The tenaculum was removed. Pressure was  used to control the tenaculum site. Repeat bimanual examination was done to confirm good uterine tone and good hemostasis.  Bimanual massage was done.  Patient was given tranexamic acid for bleeding and Toradol for pain.  Repeat speculum exam  revealed adequate hemostasis.  the procedure was then terminated. Patient tolerated the procedure well sponge lap and needle count were correct x3. Patient seems recovery room in stable condition.  Caster Fayette A. 07/01/2012 10:53 AM

## 2021-03-12 NOTE — Anesthesia Procedure Notes (Signed)
Procedure Name: Intubation Date/Time: 03/12/2021 1:54 PM Performed by: Bonney Aid, CRNA Pre-anesthesia Checklist: Patient identified, Emergency Drugs available, Suction available and Patient being monitored Patient Re-evaluated:Patient Re-evaluated prior to induction Oxygen Delivery Method: Circle system utilized Preoxygenation: Pre-oxygenation with 100% oxygen Induction Type: IV induction Ventilation: Mask ventilation without difficulty Laryngoscope Size: Mac and 3 Grade View: Grade I Tube type: Oral Tube size: 7.0 mm Number of attempts: 1 Airway Equipment and Method: Stylet Placement Confirmation: ETT inserted through vocal cords under direct vision, positive ETCO2 and breath sounds checked- equal and bilateral Secured at: 21 cm Tube secured with: Tape Dental Injury: Teeth and Oropharynx as per pre-operative assessment

## 2021-03-12 NOTE — Transfer of Care (Signed)
Immediate Anesthesia Transfer of Care Note  Patient: Veronica Bass  Procedure(s) Performed: DILATATION AND EVACUATION (D&E) 2ND TRIMESTER (Uterus) OPERATIVE ULTRASOUND (Uterus)  Patient Location: PACU  Anesthesia Type:General  Level of Consciousness: awake, alert  and oriented  Airway & Oxygen Therapy: Patient Spontanous Breathing and Patient connected to nasal cannula oxygen  Post-op Assessment: Report given to RN  Post vital signs: Reviewed and stable  Last Vitals:  Vitals Value Taken Time  BP 114/95   Temp    Pulse 95 03/12/21 1442  Resp 25 03/12/21 1442  SpO2 100 % 03/12/21 1442  Vitals shown include unvalidated device data.  Last Pain:  Vitals:   03/12/21 1208  TempSrc: Oral         Complications: No notable events documented.

## 2021-03-12 NOTE — Discharge Instructions (Signed)
  Post Anesthesia Home Care Instructions  Activity: Get plenty of rest for the remainder of the day. A responsible adult should stay with you for 24 hours following the procedure.  For the next 24 hours, DO NOT: -Drive a car -Operate machinery -Drink alcoholic beverages -Take any medication unless instructed by your physician -Make any legal decisions or sign important papers.  Meals: Start with liquid foods such as gelatin or soup. Progress to regular foods as tolerated. Avoid greasy, spicy, heavy foods. If nausea and/or vomiting occur, drink only clear liquids until the nausea and/or vomiting subsides. Call your physician if vomiting continues.  Special Instructions/Symptoms: Your throat may feel dry or sore from the anesthesia or the breathing tube placed in your throat during surgery. If this causes discomfort, gargle with warm salt water. The discomfort should disappear within 24 hours.  If you had a scopolamine patch placed behind your ear for the management of post- operative nausea and/or vomiting:  1. The medication in the patch is effective for 72 hours, after which it should be removed.  Wrap patch in a tissue and discard in the trash. Wash hands thoroughly with soap and water. 2. You may remove the patch earlier than 72 hours if you experience unpleasant side effects which may include dry mouth, dizziness or visual disturbances. 3. Avoid touching the patch. Wash your hands with soap and water after contact with the patch.   DISCHARGE INSTRUCTIONS: D&C / D&E The following instructions have been prepared to help you care for yourself upon your return home.   Personal hygiene: . Use sanitary pads for vaginal drainage, not tampons. . Shower the day after your procedure. . NO tub baths, pools or Jacuzzis for 2-3 weeks. . Wipe front to back after using the bathroom.  Activity and limitations: . Do NOT drive or operate any equipment for 24 hours. The effects of anesthesia are  still present and drowsiness may result. . Do NOT rest in bed all day. . Walking is encouraged. . Walk up and down stairs slowly. . You may resume your normal activity in one to two days or as indicated by your physician.  Sexual activity: NO intercourse for at least 2 weeks after the procedure, or as indicated by your physician.  Diet: Eat a light meal as desired this evening. You may resume your usual diet tomorrow.  Return to work: You may resume your work activities in one to two days or as indicated by your doctor.  What to expect after your surgery: Expect to have vaginal bleeding/discharge for 2-3 days and spotting for up to 10 days. It is not unusual to have soreness for up to 1-2 weeks. You may have a slight burning sensation when you urinate for the first day. Mild cramps may continue for a couple of days. You may have a regular period in 2-6 weeks.  Call your doctor for any of the following: . Excessive vaginal bleeding, saturating and changing one pad every hour. . Inability to urinate 6 hours after discharge from hospital. . Pain not relieved by pain medication. . Fever of 100.4 F or greater. . Unusual vaginal discharge or odor.   Call for an appointment:      

## 2021-03-12 NOTE — Anesthesia Postprocedure Evaluation (Signed)
Anesthesia Post Note  Patient: Veronica Bass  Procedure(s) Performed: DILATATION AND EVACUATION (D&E) 2ND TRIMESTER (Uterus) OPERATIVE ULTRASOUND (Uterus)     Patient location during evaluation: PACU Anesthesia Type: General Level of consciousness: awake and alert Pain management: pain level controlled Vital Signs Assessment: post-procedure vital signs reviewed and stable Respiratory status: spontaneous breathing, nonlabored ventilation, respiratory function stable and patient connected to nasal cannula oxygen Cardiovascular status: blood pressure returned to baseline and stable Postop Assessment: no apparent nausea or vomiting Anesthetic complications: no   No notable events documented.  Last Vitals:  Vitals:   03/12/21 1500 03/12/21 1515  BP: 105/61 101/62  Pulse: 61 (!) 58  Resp: 12 12  Temp:    SpO2: 100% 99%    Last Pain:  Vitals:   03/12/21 1515  TempSrc:   PainSc: 0-No pain                 Trevor Iha

## 2021-03-12 NOTE — Brief Op Note (Signed)
03/12/2021  2:37 PM  PATIENT:  Veronica Bass  30 y.o. female  PRE-OPERATIVE DIAGNOSIS:  Missed Abortion 16weeks  POST-OPERATIVE DIAGNOSIS:  Missed Abortion 16weeks  PROCEDURE:  Procedure(s): DILATATION AND EVACUATION (D&E) 2ND TRIMESTER (N/A) OPERATIVE ULTRASOUND (N/A)  SURGEON:  Surgeon(s) and Role:    * Noland Fordyce, MD - Primary  PHYSICIAN ASSISTANT: None  ASSISTANTS: none   ANESTHESIA:   local and general  EBL:  300 mL   BLOOD ADMINISTERED:none  DRAINS: none   LOCAL MEDICATIONS USED:  MARCAINE     SPECIMEN: Products of conception  DISPOSITION OF SPECIMEN:  PATHOLOGY  COUNTS:  YES  TOURNIQUET:  * No tourniquets in log *  DICTATION: .Note written in EPIC  PLAN OF CARE: Discharge to home after PACU  PATIENT DISPOSITION:  PACU - hemodynamically stable.   Delay start of Pharmacological VTE agent (>24hrs) due to surgical blood loss or risk of bleeding: yes

## 2021-03-13 ENCOUNTER — Encounter (HOSPITAL_BASED_OUTPATIENT_CLINIC_OR_DEPARTMENT_OTHER): Payer: Self-pay | Admitting: Obstetrics

## 2021-03-13 LAB — SURGICAL PATHOLOGY

## 2021-07-07 ENCOUNTER — Telehealth: Payer: Self-pay | Admitting: Internal Medicine

## 2021-07-07 MED ORDER — OSELTAMIVIR PHOSPHATE 75 MG PO CAPS: 75.0000 mg | ORAL_CAPSULE | Freq: Two times a day (BID) | ORAL | 0 refills | Status: AC

## 2021-07-07 NOTE — Telephone Encounter (Signed)
Had Childrens party over the weekend, friend has called saying they tested positive for Flu, now sick with fever, headache and nausea, requesting tamiflu.  Tamiflu 106m BID x 5 days.
